# Patient Record
Sex: Male | Born: 1965 | Race: Black or African American | Hispanic: No | Marital: Single | Smoking: Current every day smoker
Health system: Southern US, Community
[De-identification: ages and names within clinical notes are randomized; demographics above are authoritative.]

---

## 2008-05-28 ENCOUNTER — Emergency Department: Payer: Self-pay | Admitting: Emergency Medicine

## 2008-05-28 IMAGING — CR DG CHEST 2V
1 series · 2 of 2 positions shown · non-contrast
Comparison: none

REASON FOR EXAM: abnormal EKG , peripheral edema
COMMENTS:

PROCEDURE:     DXR - DXR CHEST PA (OR AP) AND LATERAL  - [DATE]  [DATE]
RESULT:     There is evidence of diffuse pulmonary edema. There is no
significant effusion. The heart is mildly enlarged. There is slightly
shallow inspiration. There is motion artifact on the lateral view.

[Series 1: view not recorded · 0.17mm/px · 2 of 2 slices shown]
[im 1/2]
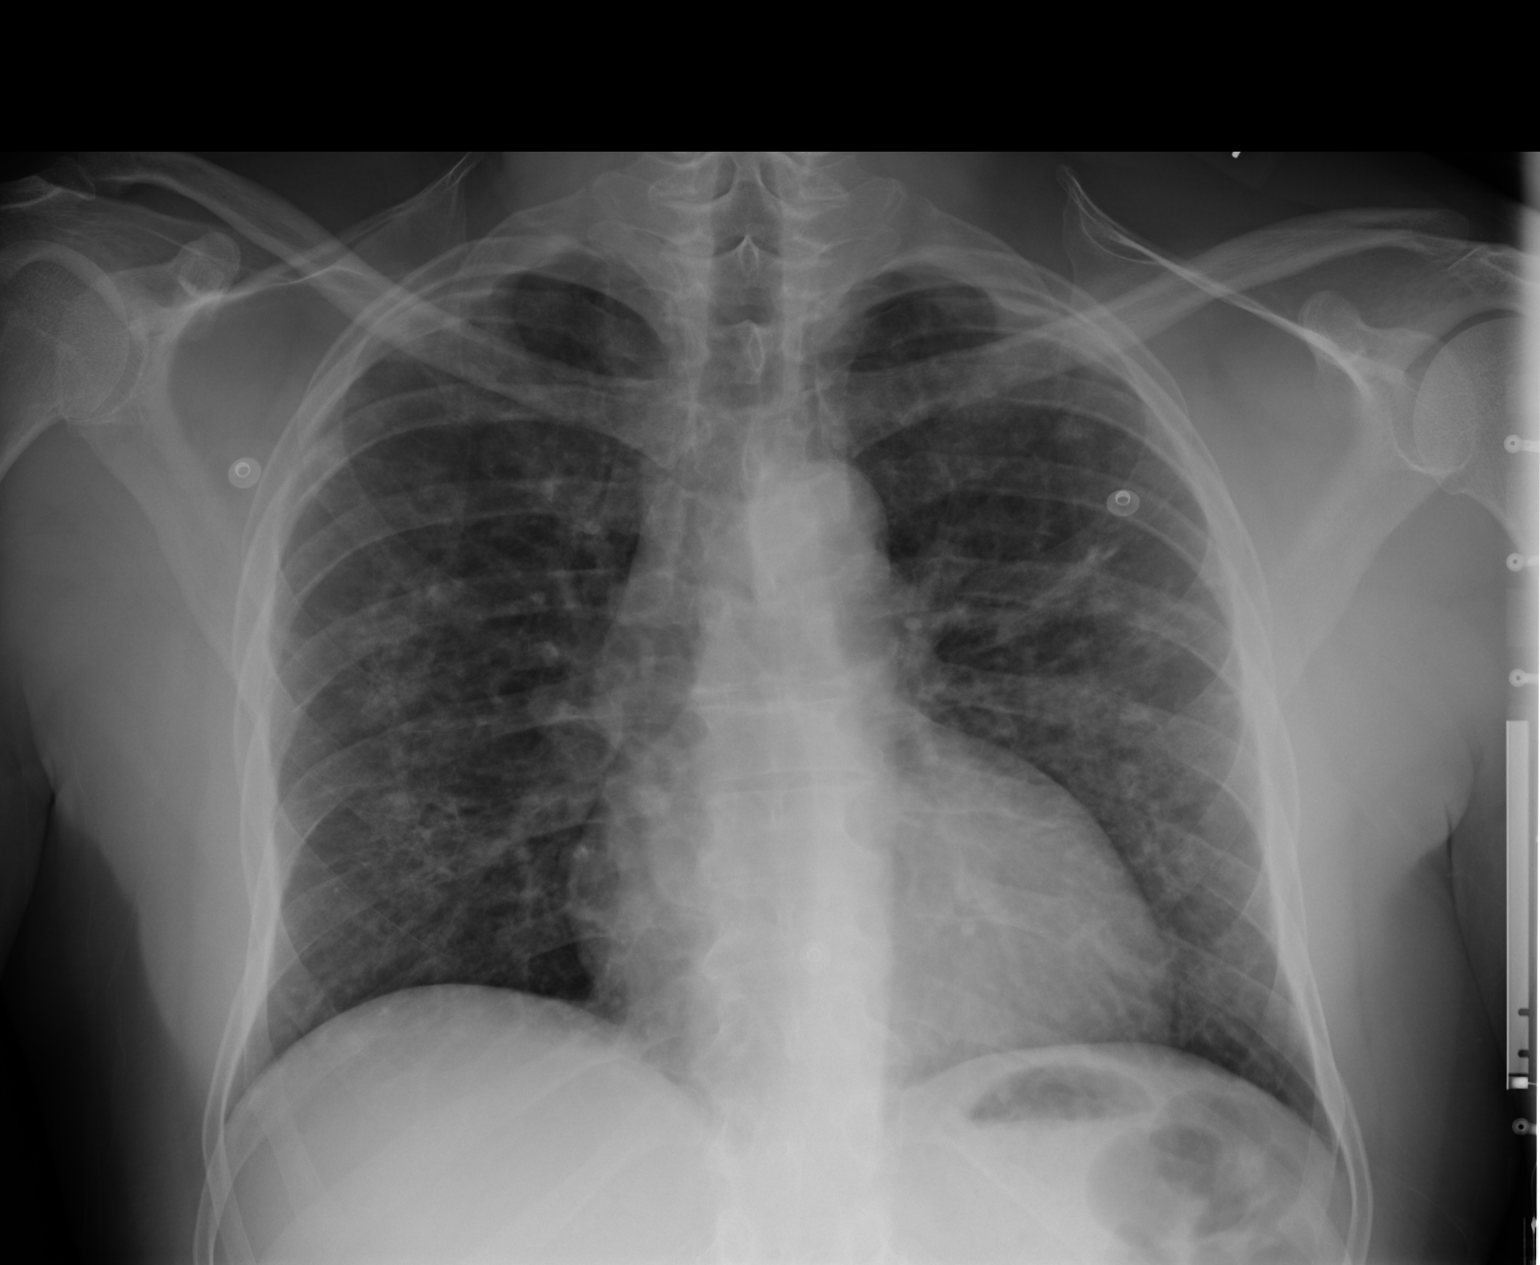
[im 2/2]
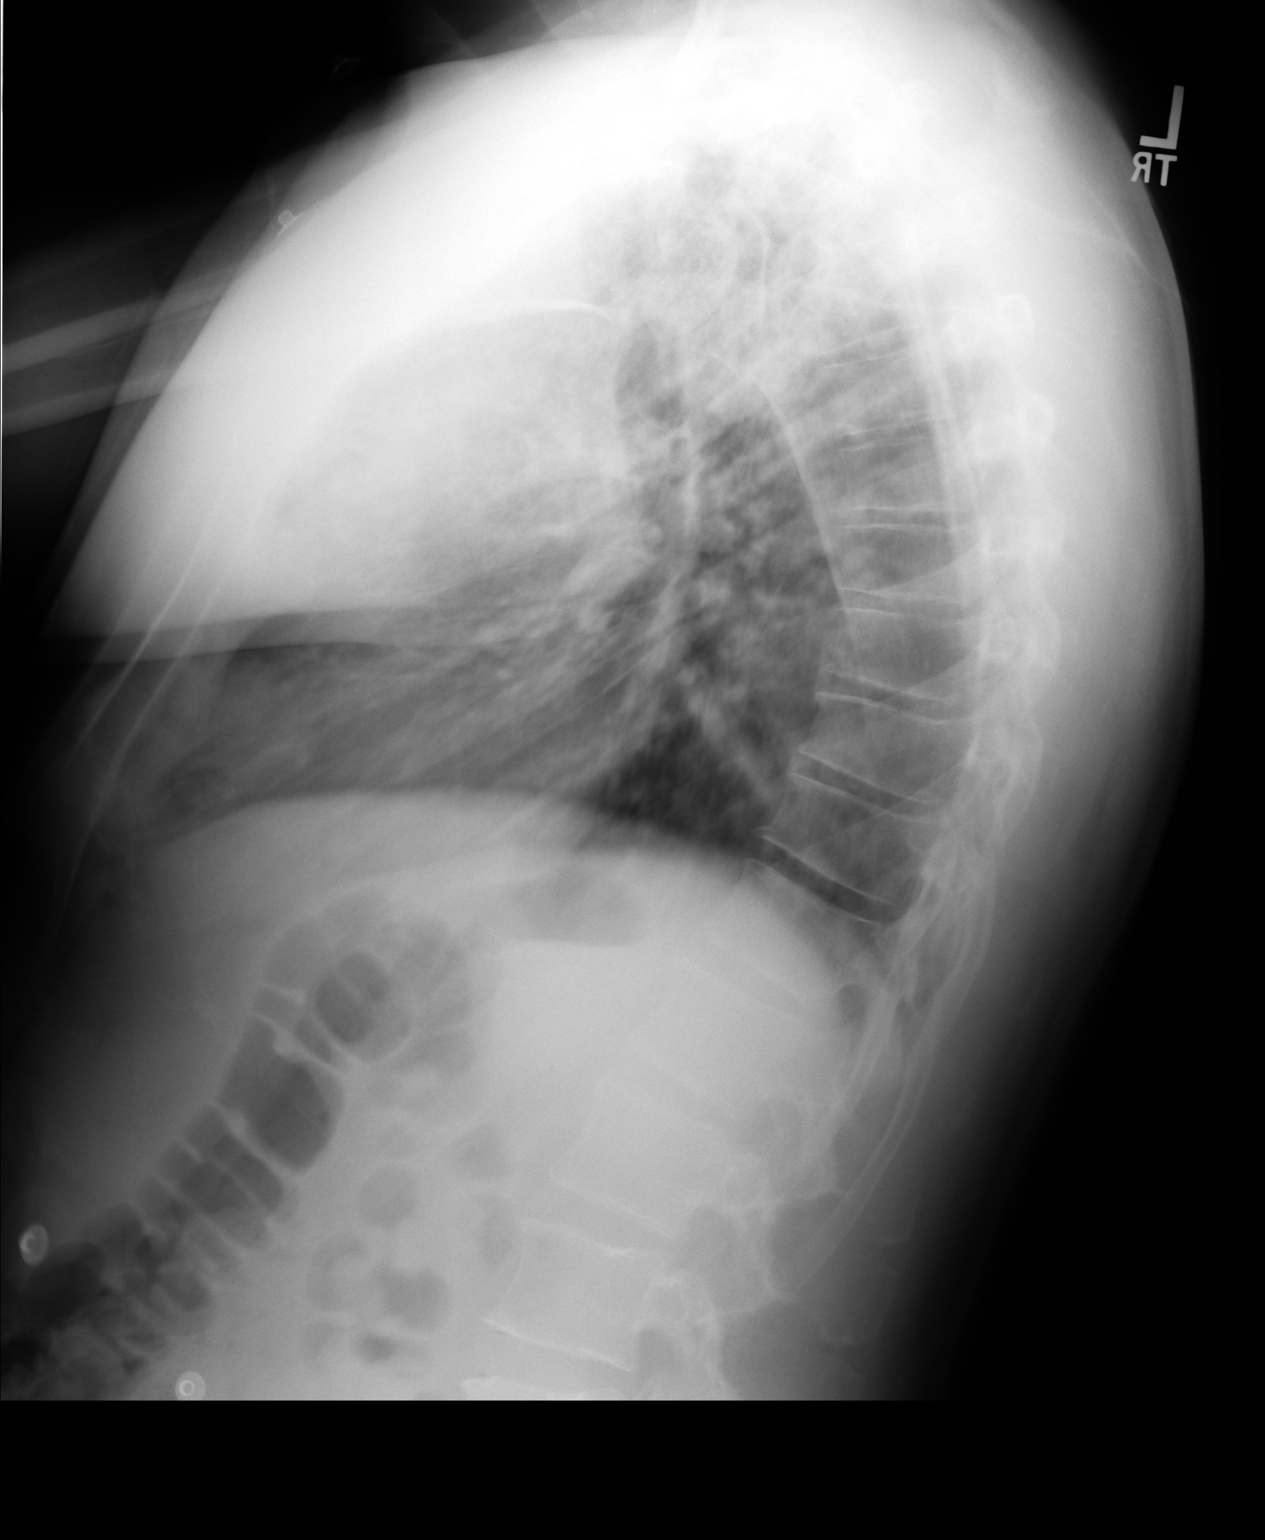

[2 of 2 positions shown; findings below may reference images not displayed]

IMPRESSION: The appearance suggests pulmonary edema. Bronchitis with
areas of patchy infiltrate or atelectasis may give a similar appearance.
Clinical correlation and continued follow-up is recommended.

## 2014-03-26 ENCOUNTER — Ambulatory Visit: Payer: Self-pay | Admitting: Internal Medicine

## 2014-03-26 LAB — BASIC METABOLIC PANEL
ANION GAP: 6 — AB (ref 7–16)
BUN: 17 mg/dL (ref 7–18)
CALCIUM: 9.4 mg/dL (ref 8.5–10.1)
Chloride: 103 mmol/L (ref 98–107)
Co2: 32 mmol/L (ref 21–32)
Creatinine: 1.38 mg/dL — ABNORMAL HIGH (ref 0.60–1.30)
EGFR (African American): >60
GFR CALC NON AF AMER: 58 — AB
GLUCOSE: 162 mg/dL — AB (ref 65–99)
OSMOLALITY: 286 (ref 275–301)
Potassium: 3.1 mmol/L — ABNORMAL LOW (ref 3.5–5.1)
SODIUM: 141 mmol/L (ref 136–145)

## 2014-03-26 LAB — PROTEIN / CREATININE RATIO, URINE
Creatinine, Urine: 55.8 mg/dL (ref 30.0–125.0)
Protein, Random Urine: 21 mg/dL — ABNORMAL HIGH (ref 0–12)
Protein/Creat. Ratio: 376 mg/gCREAT — ABNORMAL HIGH (ref 0–200)

## 2021-04-25 ENCOUNTER — Other Ambulatory Visit: Payer: Self-pay

## 2021-04-25 ENCOUNTER — Emergency Department: Payer: BLUE CROSS/BLUE SHIELD

## 2021-04-25 ENCOUNTER — Emergency Department
Admission: EM | Admit: 2021-04-25 | Discharge: 2021-04-25 | Disposition: A | Discharge status: Home / Self Care | Payer: BLUE CROSS/BLUE SHIELD | Attending: Emergency Medicine | Admitting: Emergency Medicine

## 2021-04-25 DIAGNOSIS — N189 Chronic kidney disease, unspecified: Secondary | ICD-10-CM | POA: Diagnosis not present

## 2021-04-25 DIAGNOSIS — R531 Weakness: Secondary | ICD-10-CM | POA: Insufficient documentation

## 2021-04-25 DIAGNOSIS — R739 Hyperglycemia, unspecified: Secondary | ICD-10-CM | POA: Insufficient documentation

## 2021-04-25 DIAGNOSIS — Y9301 Activity, walking, marching and hiking: Secondary | ICD-10-CM | POA: Insufficient documentation

## 2021-04-25 DIAGNOSIS — R29898 Other symptoms and signs involving the musculoskeletal system: Secondary | ICD-10-CM

## 2021-04-25 DIAGNOSIS — W19XXXA Unspecified fall, initial encounter: Secondary | ICD-10-CM | POA: Insufficient documentation

## 2021-04-25 DIAGNOSIS — I129 Hypertensive chronic kidney disease with stage 1 through stage 4 chronic kidney disease, or unspecified chronic kidney disease: Secondary | ICD-10-CM | POA: Diagnosis not present

## 2021-04-25 LAB — URINALYSIS, ROUTINE W REFLEX MICROSCOPIC
Bacteria, UA: NONE SEEN
Bilirubin Urine: NEGATIVE
Glucose, UA: >=500 mg/dL — AB
Ketones, ur: NEGATIVE mg/dL
Leukocytes,Ua: NEGATIVE
Nitrite: NEGATIVE
Protein, ur: NEGATIVE mg/dL
Specific Gravity, Urine: 1.024 (ref 1.005–1.030)
Squamous Epithelial / HPF: NONE SEEN (ref 0–5)
pH: 6 (ref 5.0–8.0)

## 2021-04-25 LAB — CBC WITH DIFFERENTIAL/PLATELET
Abs Immature Granulocytes: 0.03 10*3/uL (ref 0.00–0.07)
Basophils Absolute: 0 10*3/uL (ref 0.0–0.1)
Basophils Relative: 0 %
Eosinophils Absolute: 0 10*3/uL (ref 0.0–0.5)
Eosinophils Relative: 0 %
HCT: 47.9 % (ref 39.0–52.0)
Hemoglobin: 15.8 g/dL (ref 13.0–17.0)
Immature Granulocytes: 0 %
Lymphocytes Relative: 9 %
Lymphs Abs: 0.9 10*3/uL (ref 0.7–4.0)
MCH: 29 pg (ref 26.0–34.0)
MCHC: 33 g/dL (ref 30.0–36.0)
MCV: 87.9 fL (ref 80.0–100.0)
Monocytes Absolute: 0.3 10*3/uL (ref 0.1–1.0)
Monocytes Relative: 3 %
Neutro Abs: 8.9 10*3/uL — ABNORMAL HIGH (ref 1.7–7.7)
Neutrophils Relative %: 88 %
Platelets: 276 10*3/uL (ref 150–400)
RBC: 5.45 MIL/uL (ref 4.22–5.81)
RDW: 12.6 % (ref 11.5–15.5)
WBC: 10 10*3/uL (ref 4.0–10.5)
nRBC: 0 % (ref 0.0–0.2)

## 2021-04-25 LAB — BASIC METABOLIC PANEL
Anion gap: 14 (ref 5–15)
BUN: 20 mg/dL (ref 6–20)
CO2: 25 mmol/L (ref 22–32)
Calcium: 10.4 mg/dL — ABNORMAL HIGH (ref 8.9–10.3)
Chloride: 103 mmol/L (ref 98–111)
Creatinine, Ser: 1.72 mg/dL — ABNORMAL HIGH (ref 0.61–1.24)
GFR, Estimated: 46 mL/min — ABNORMAL LOW (ref >60)
Glucose, Bld: 196 mg/dL — ABNORMAL HIGH (ref 70–99)
Potassium: 3.6 mmol/L (ref 3.5–5.1)
Sodium: 142 mmol/L (ref 135–145)

## 2021-04-25 LAB — TROPONIN I (HIGH SENSITIVITY): Troponin I (High Sensitivity): 11 ng/L (ref <18)

## 2021-04-25 IMAGING — CT CT HEAD W/O CM
4 series · 16 of 47 positions shown, 18 images · non-contrast
Comparison: None.

CLINICAL DATA: Fall, weakness



[Series 2: head wo · axial · 0.44mm/px · z∈[-102,+18]mm · 7 of 32 slices shown, 9 images]
[im 4/32  brain]
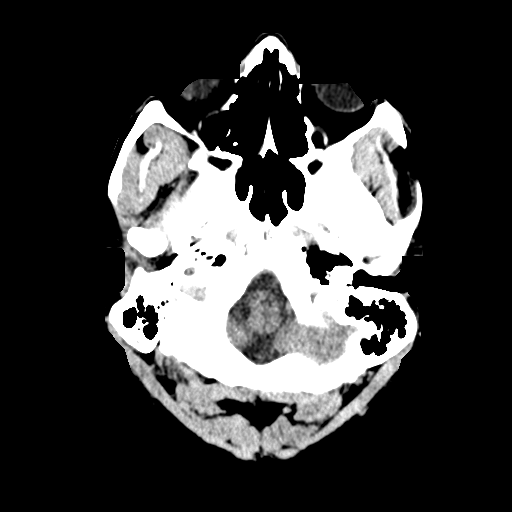
[im 4/32  bone]
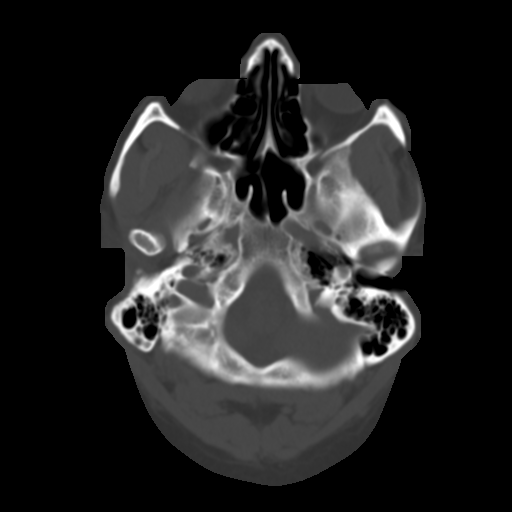
[im 8/32  brain]
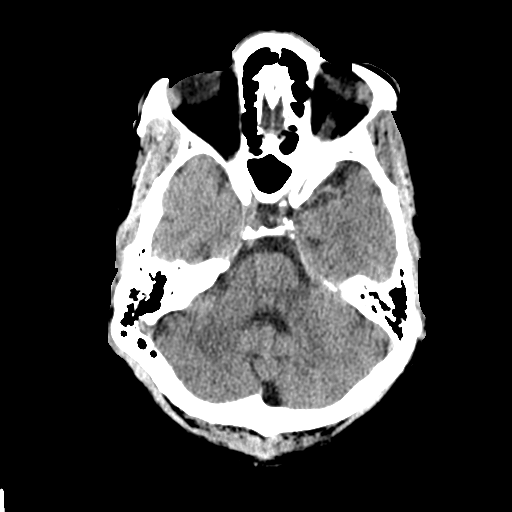
[im 12/32  brain]
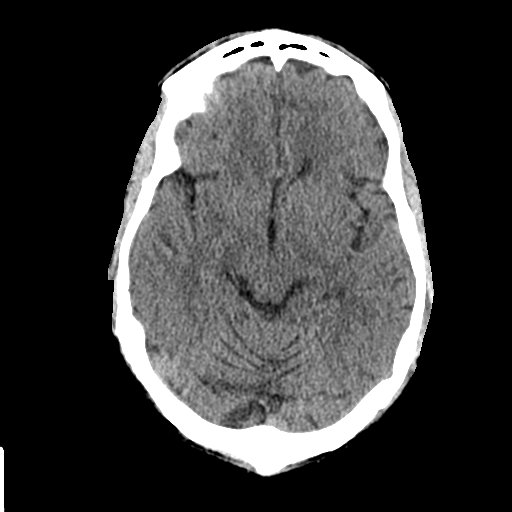
[im 16/32  brain]
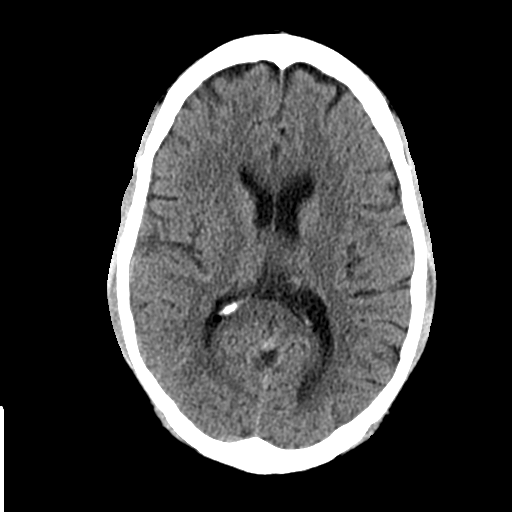
[im 20/32  brain]
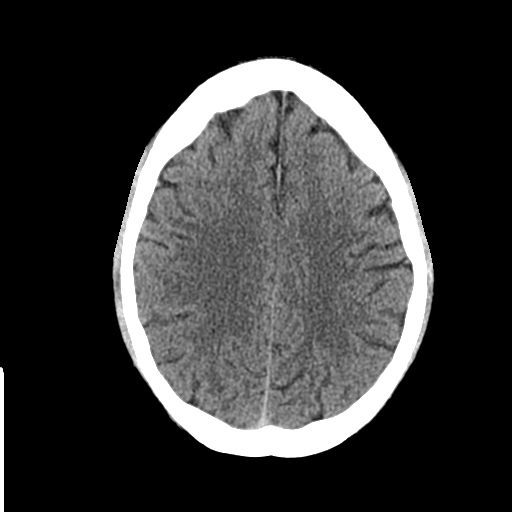
[im 20/32  bone]
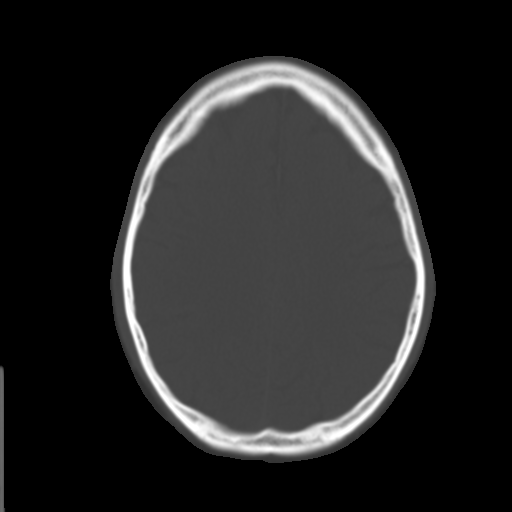
[im 24/32  brain]
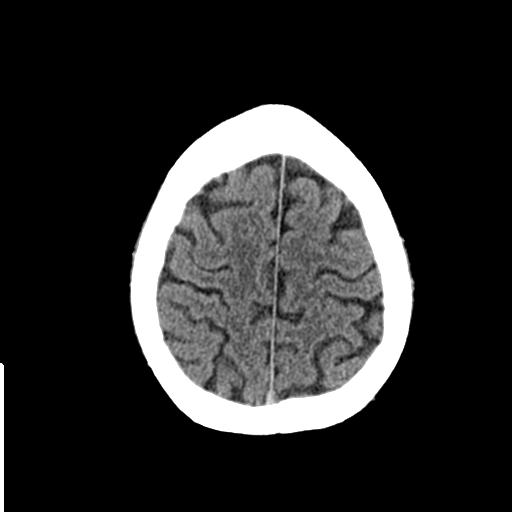
[im 28/32  brain]
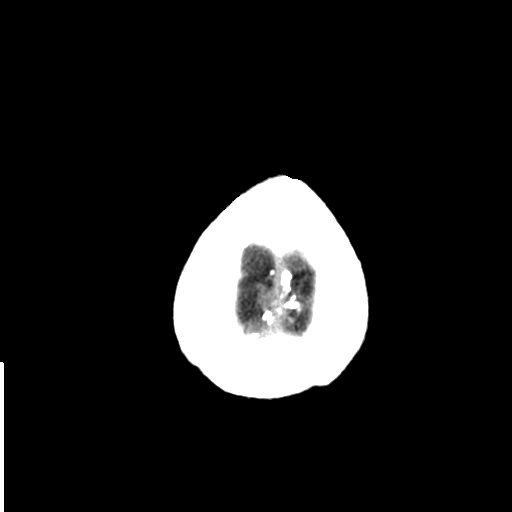

[Series 3: head bone · axial · 0.44mm/px · z∈[-103,-71]mm · 3 of 80 slices shown]
[im 8/80  bone]
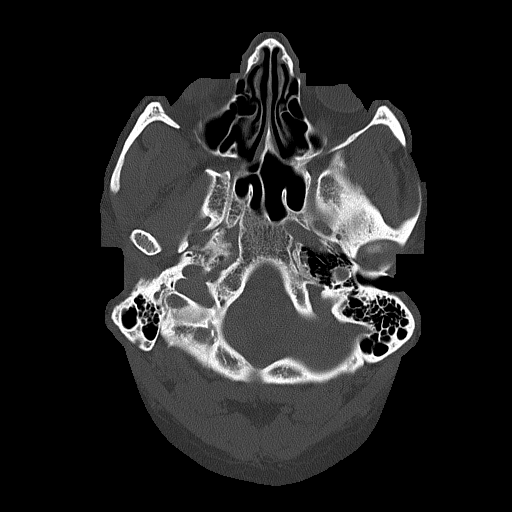
[im 16/80  bone]
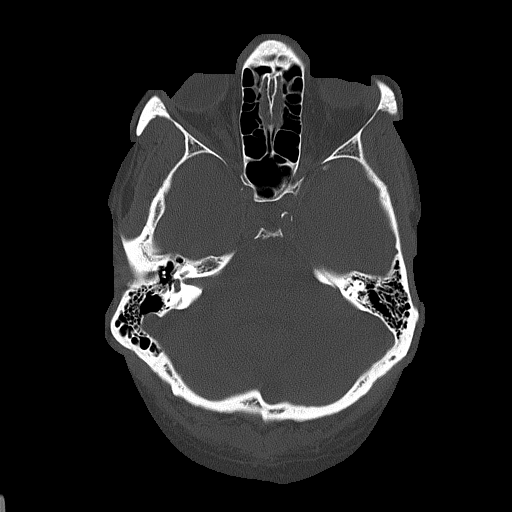
[im 24/80  bone]
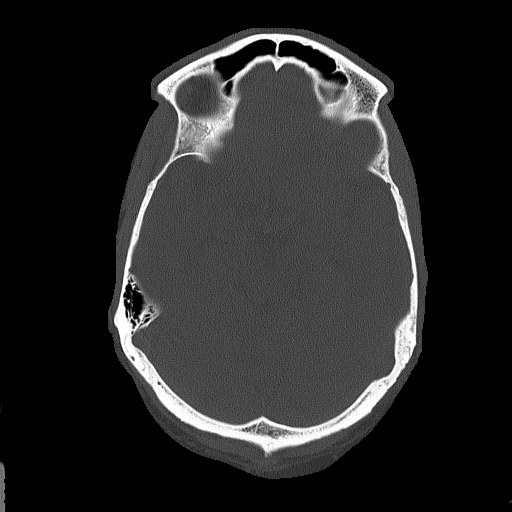

[Series 4: coronal soft tissue · coronal · 0.33mm/px · 3 of 70 slices shown]
[im 24/70  brain]
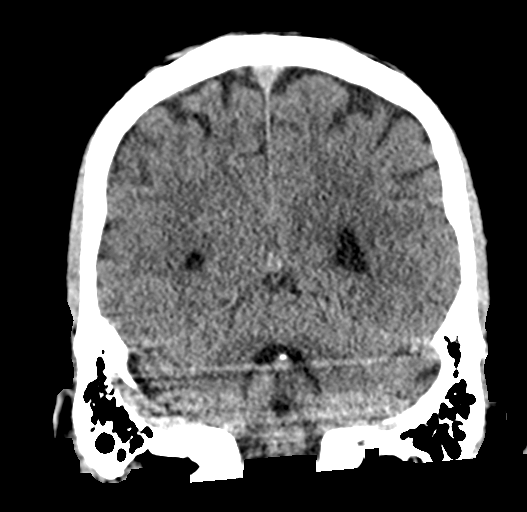
[im 31/70  brain]
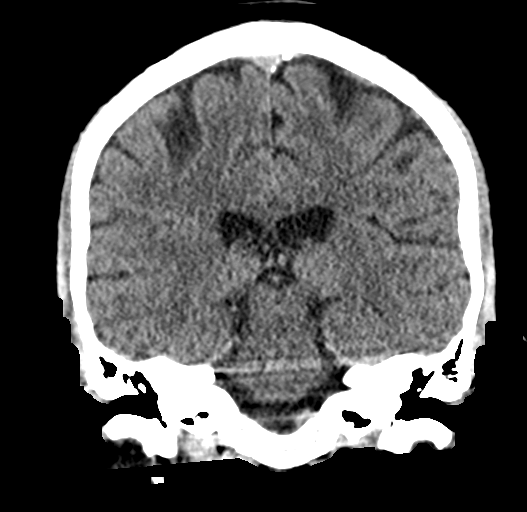
[im 39/70  brain]
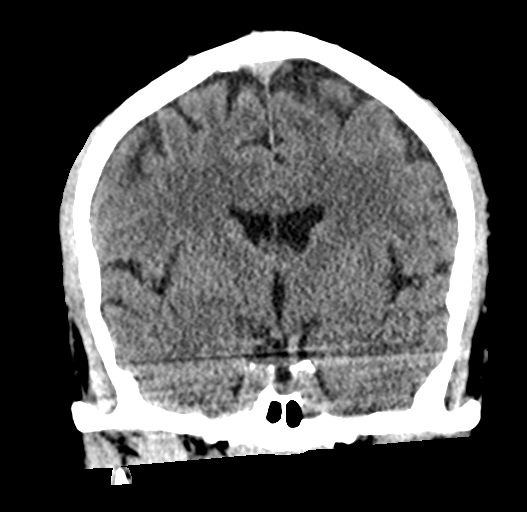

[Series 5: sagittal soft tissue · sagittal · 0.33mm/px · 3 of 56 slices shown]
[im 19/56  brain]
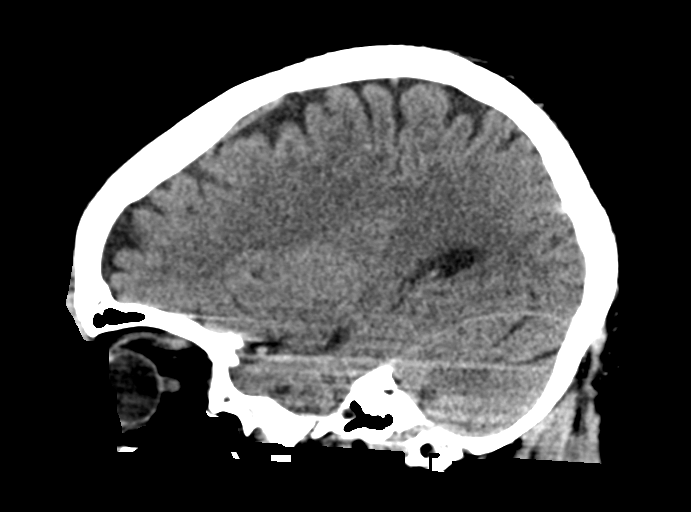
[im 28/56  brain]
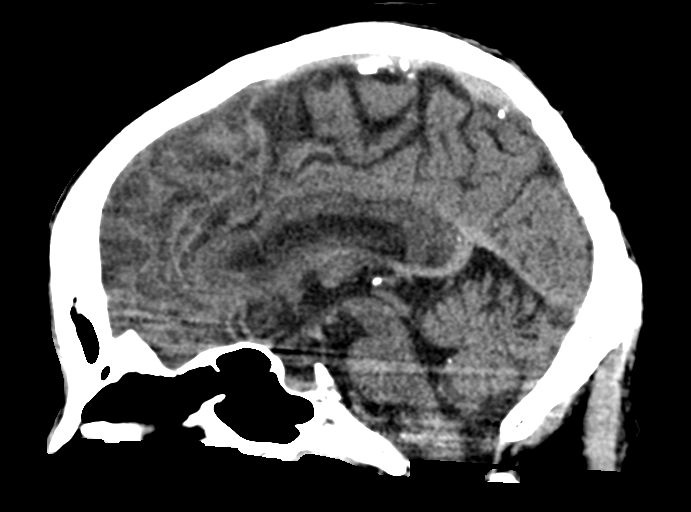
[im 37/56  brain]
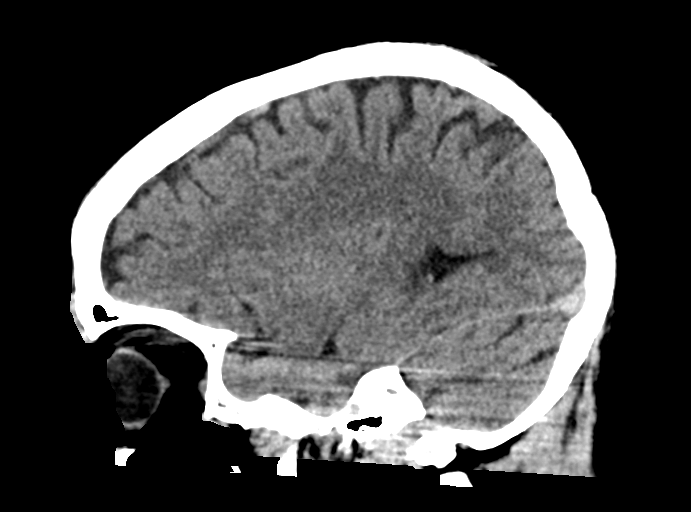

[16 of 47 positions shown; findings below may reference images not displayed]

FINDINGS: Brain: No evidence of acute infarction, hemorrhage, hydrocephalus,
extra-axial collection or mass lesion/mass effect.

Vascular: No hyperdense vessel or unexpected calcification.

Skull: Normal. Negative for fracture or focal lesion.

Sinuses/Orbits: No acute finding.

Other: None.
IMPRESSION: No acute intracranial pathology.

## 2021-04-25 IMAGING — CR DG HIP (WITH OR WITHOUT PELVIS) 2-3V*L*
1 series · 3 of 3 positions shown · non-contrast
Comparison: None.

CLINICAL DATA: Fall, left leg pain.

EXAM:
DG HIP (WITH OR WITHOUT PELVIS) 2-3V LEFT

[Series 1: dg hip unilat w or w/o pelvis 2-3 views  · non-contrast · 0.14mm/px · 3 of 3 slices shown]
[im 1/3]
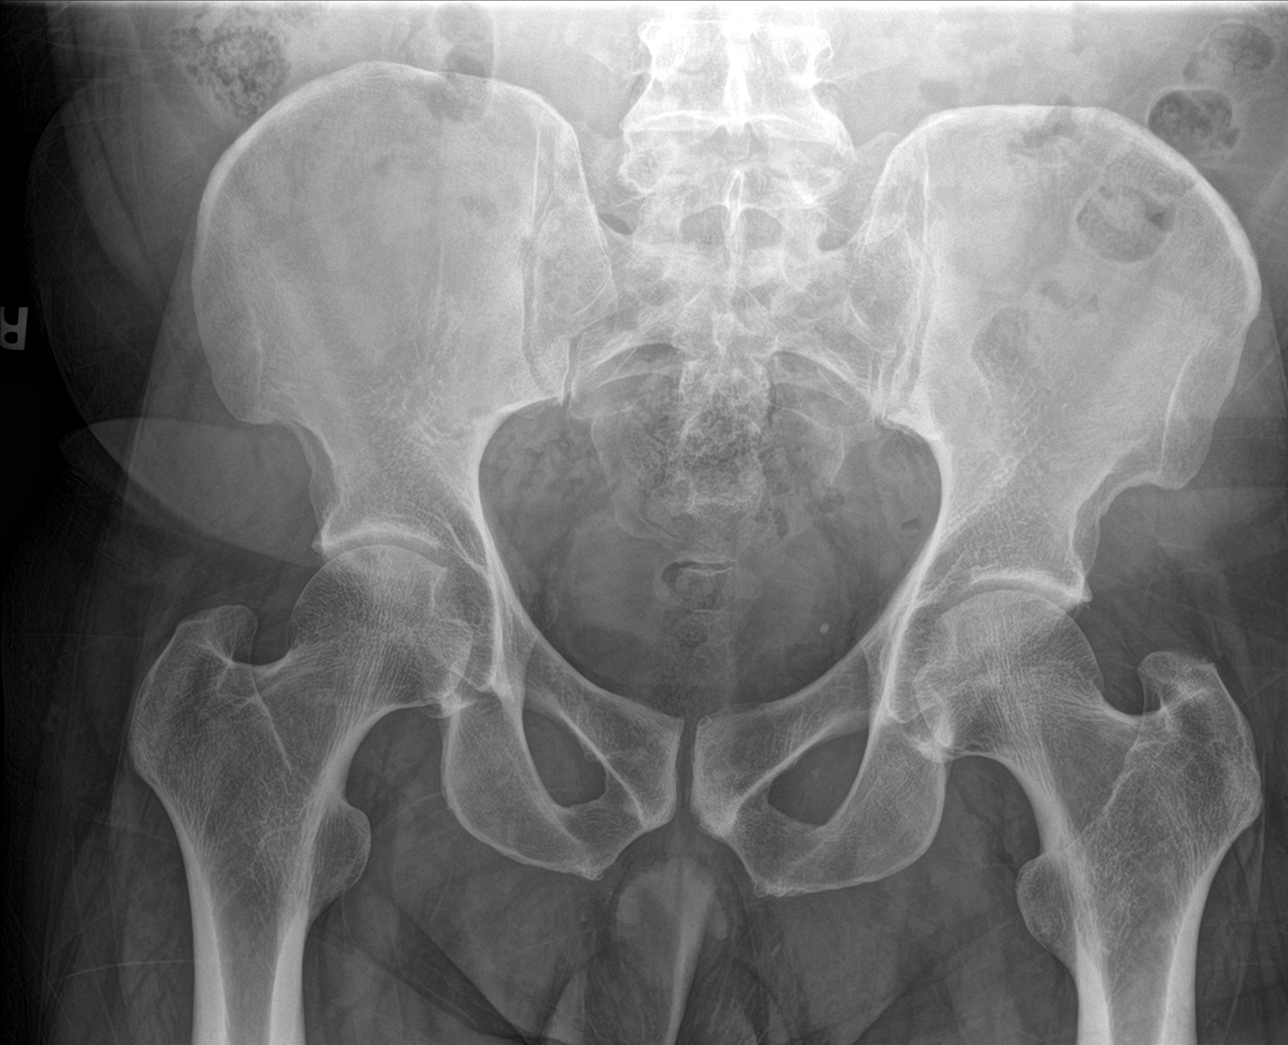
[im 2/3]
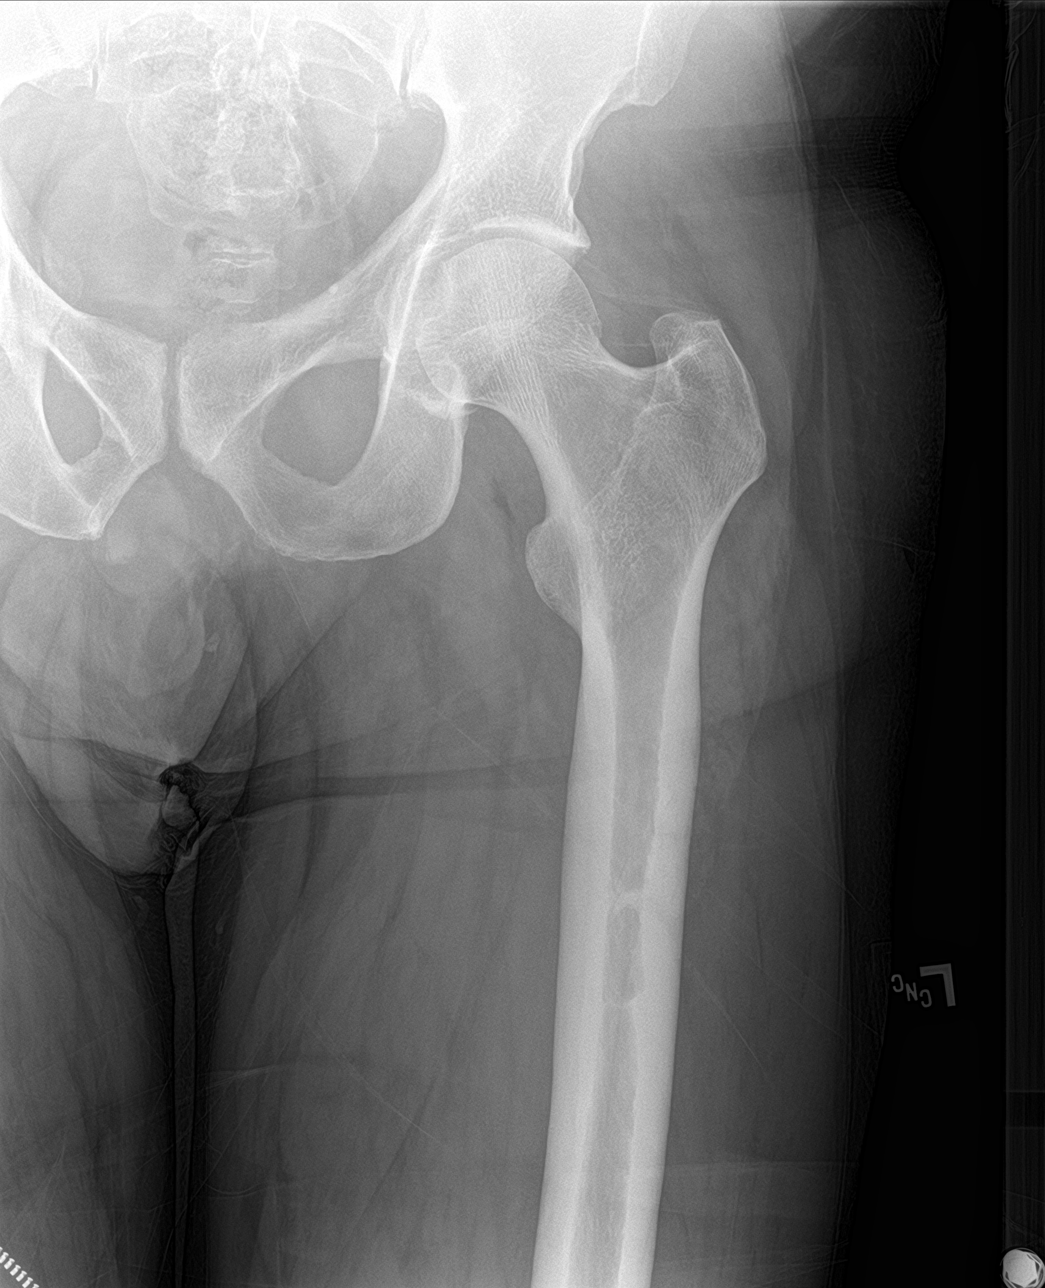
[im 3/3]
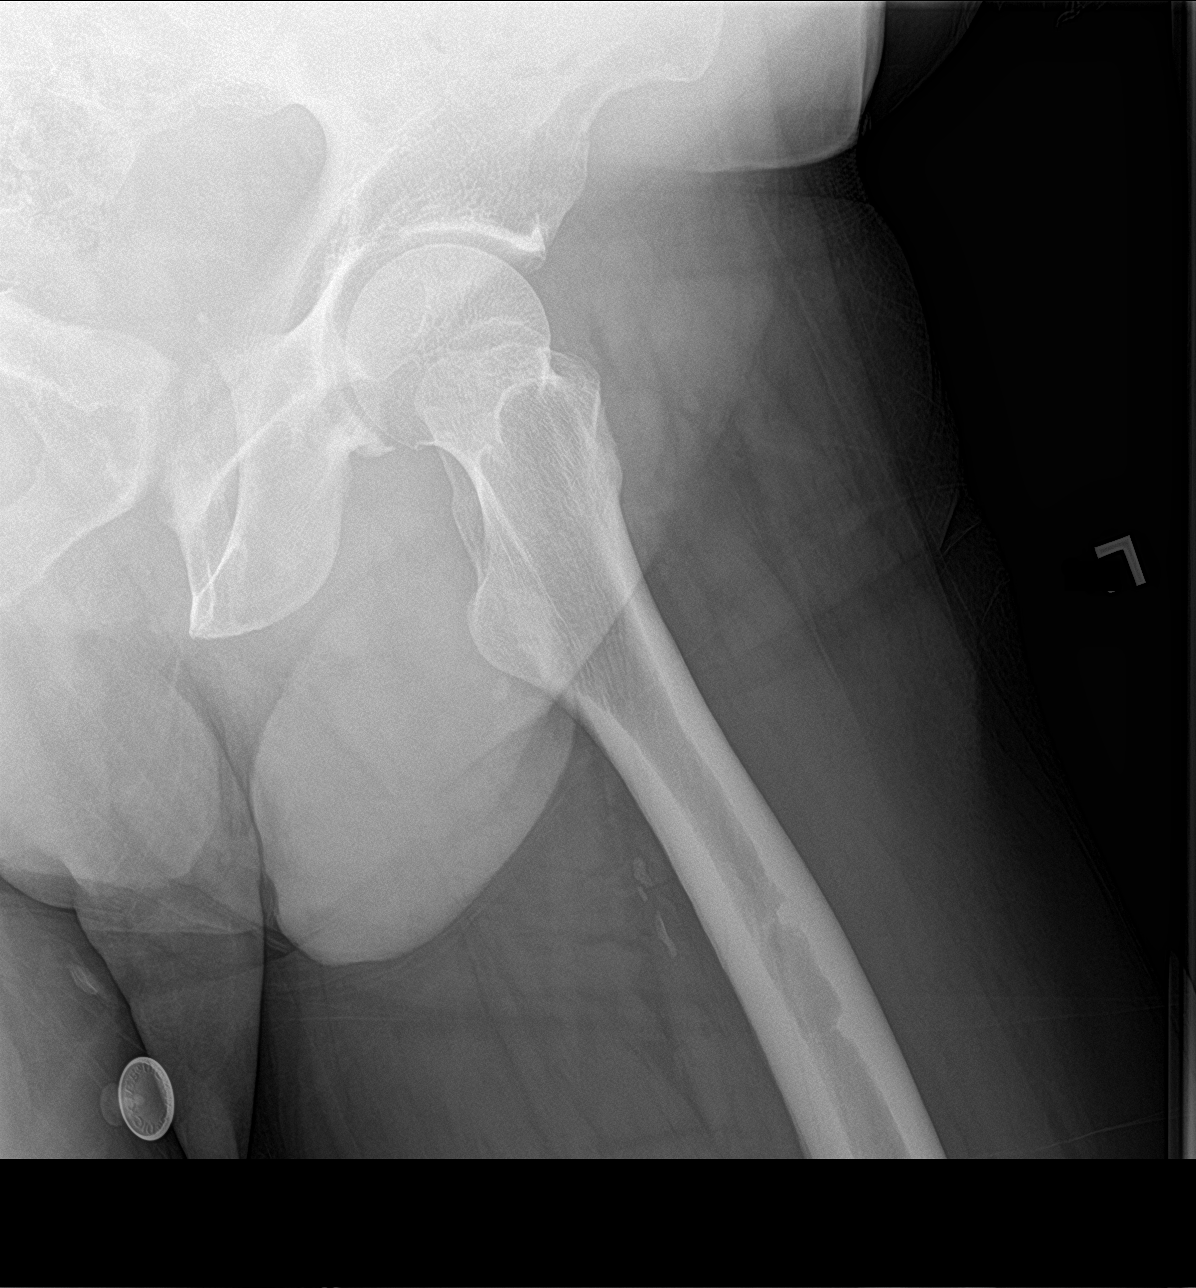

[3 of 3 positions shown; findings below may reference images not displayed]

FINDINGS: No acute fracture of the pelvis or left hip. Femoral head is well
seated in the acetabulum. The hip joint spaces preserved. Pubic rami
are intact. Pubic symphysis and sacroiliac joints are congruent. No
erosions or evidence of avascular necrosis. No soft tissue
abnormalities.
IMPRESSION: No fracture or acute osseous findings of the pelvis or left hip.

## 2021-04-25 NOTE — ED Notes (Signed)
Pt to CT

## 2021-04-25 NOTE — ED Triage Notes (Addendum)
Pt come with c/o fall. Pt states his left leg gave out and he fell. No loc or hitting head.  ? ?Pt denies any CP, dizziness, sob or weakness. ?

## 2021-04-25 NOTE — Discharge Instructions (Signed)
Please call and schedule a follow up with your primary care provider.  ? ?Use your walker to prevent falling. ? ?Return to the ER for symptoms that change or worsen if unable to schedule an appointment. ?

## 2021-04-25 NOTE — ED Notes (Signed)
See triage note. Pt states since last night L leg has been weak and today L leg gave out on him and he fell 3 times today. No head trauma. Denies any known injuries. ?

## 2021-04-25 NOTE — ED Provider Notes (Signed)
? ?[facility] ?Provider Note ? ? ? Event Date/Time  ? First MD Initiated Contact with Patient 04/25/21 1730   ?  (approximate) ? ? ?History  ? ?Fall ? ? ?HPI ? ?Shane Dunn is a 56 y.o. male with a history of chronic kidney disease, hypertension and as listed in EMR presents to the emergency department for treatment and evaluation of sudden onset left leg weakness.  Patient states that after work he was walking and his left leg completely gave out causing him to fall.  He did not strike his head or lose consciousness.  Since that time, he has been unable to stand and bear weight on the leg.  He denies headache, weakness in upper extremities, change in vision, pain, loss of bowel or bladder control. ? ?  ? ? ?Physical Exam  ? ?Triage Vital Signs: ?ED Triage Vitals [04/25/21 1707]  ?Enc Vitals Group  ?   BP (!) 134/94  ?   Pulse Rate 92  ?   Resp 18  ?   Temp 98.8 ?F (37.1 ?C)  ?   Temp src   ?   SpO2 100 %  ?   Weight   ?   Height   ?   Head Circumference   ?   Peak Flow   ?   Pain Score 3  ?   Pain Loc   ?   Pain Edu?   ?   Excl. in GC?   ? ? ?Most recent vital signs: ?Vitals:  ? 04/25/21 1707 04/25/21 1922  ?BP: (!) 134/94 (!) 156/106  ?Pulse: 92 91  ?Resp: 18 (!) 21  ?Temp: 98.8 ?F (37.1 ?C)   ?SpO2: 100% 96%  ? ? ?General: Awake, no distress.  ?CV:  Good peripheral perfusion.  ?Resp:  Normal effort.  ?Abd:  No distention.  ?Other:  Neuro exam unremarkable with the exception of patient inability to passively bend left knee without using his hands ? ? ?ED Results / Procedures / Treatments  ? ?Labs ?(all labs ordered are listed, but only abnormal results are displayed) ?Labs Reviewed  ?BASIC METABOLIC PANEL - Abnormal; Notable for the following components:  ?    Result Value  ? Glucose, Bld 196 (*)   ? Creatinine, Ser 1.72 (*)   ? Calcium 10.4 (*)   ? GFR, Estimated 46 (*)   ? All other components within normal limits  ?CBC WITH DIFFERENTIAL/PLATELET - Abnormal; Notable for the following  components:  ? Neutro Abs 8.9 (*)   ? All other components within normal limits  ?URINALYSIS, ROUTINE W REFLEX MICROSCOPIC - Abnormal; Notable for the following components:  ? Color, Urine STRAW (*)   ? APPearance CLEAR (*)   ? Glucose, UA >=500 (*)   ? Hgb urine dipstick MODERATE (*)   ? All other components within normal limits  ?TROPONIN I (HIGH SENSITIVITY)  ?TROPONIN I (HIGH SENSITIVITY)  ? ? ? ?EKG ? ?ED ECG REPORT ?I, Kem Boroughs, FNP-BC personally viewed and interpreted this ECG. ? ? Date: 04/25/2021 ? EKG Time: 1912 ? Rate: 82 ? Rhythm: normal EKG, normal sinus rhythm ? Axis: normal ? Intervals:none ? ST&T Change: non ? ? ? ?RADIOLOGY ? ?Image and radiology report reviewed by me. ? ?CT head negative for acute concerns. ? ?X-ray of the left hip negative for acute concerns. ? ?PROCEDURES: ? ?Critical Care performed: No ? ?Procedures ? ? ?MEDICATIONS ORDERED IN ED: ?Medications - No data to display ? ? ?  IMPRESSION / MDM / ASSESSMENT AND PLAN / ED COURSE  ? ?I have reviewed the triage note. ? ?Differential diagnosis includes, but is not limited to, Hip fracture, CVA, Hip strain ? ?56 year old male presents to the ER due to feeling weak in his left hip. He denies recent injury of the hip but earlier he fell back down into his chair because it "gave out."  ? ?X-ray of the hip is negative. Neuro exam is all reassuring. CT head is without acute findings. Labs show known kidney disease and hyperglycemia. No DKA. No indication of infection.  ? ?On exam, he is not tender over the hip/sacrum/lumbar. No pain with internal or external rotation of the hip.  ? ?Patient able to stand without assistance, but seems afraid to try and take a step with the left leg. Walker provided and he was able to ambulate without additional assistance. He reported feeling "stable." At one point he hopped up in the air with both feet and landed steadily.  ? ?Plan will be to discharge him home to follow up with primary care. Return  precautions discussed. ? ?  ? ? ?FINAL CLINICAL IMPRESSION(S) / ED DIAGNOSES  ? ?Final diagnoses:  ?Complaints of leg weakness  ? ? ? ?Rx / DC Orders  ? ?ED Discharge Orders   ? ? None  ? ?  ? ? ? ?Note:  This document was prepared using Dragon voice recognition software and may include unintentional dictation errors. ?  ?Chinita Pester, FNP ?04/25/21 1947 ? ?  ?Sharyn Creamer, MD ?04/25/21 2228 ? ?

## 2021-04-25 NOTE — ED Notes (Signed)
Observed pt ambulating with a walker, noted steady and states he feels more "comfortable" with the walker. ?

## 2021-06-29 NOTE — Therapy (Signed)
?OUTPATIENT PHYSICAL THERAPY EVALUATION ? ? ?Patient Name: Shane Dunn ?MRN: [id number] ?DOB:1965-07-18, 56 y.o., male ?Today's Date: 07/05/2021 ? ? PT End of Session - 07/05/21 1410   ? ? Visit Number 1   ? Number of Visits 24   ? Date for PT Re-Evaluation 12/20/21   ? Authorization Type [employer] reporting from 07/05/2021   ? Authorization Time Period VL 30 PT/OT/Chiro   ? Authorization - Visit Number 1   ? Authorization - Number of Visits 30   ? Progress Note Due on Visit 10   ? PT Start Time 1030   ? PT Stop Time 1120   ? PT Time Calculation (min) 50 min   ? Activity Tolerance Patient tolerated treatment well;No increased pain   ? Behavior During Therapy WFL for tasks assessed/performed;Flat affect   ? ?  ?  ? ?  ? ? ?History reviewed. No pertinent past medical history. ?History reviewed. No pertinent surgical history. ?There are no problems to display for this patient. ? ? ?PCP: [facility] ? ?REFERRING PROVIDER: Luella Cook, PA-C [facility]) ? ?REFERRING DIAG: leg weakness, difficulty walking ? ?THERAPY DIAG:  ?Muscle weakness (generalized) ? ?Pain in left leg ? ?Difficulty in walking, not elsewhere classified ? ?History of falling ? ?ONSET DATE: 04/25/2021 ? ?SUBJECTIVE:                                                                                                                                                                                          ? ?SUBJECTIVE STATEMENT: ?Patient is here with his mother [name] who contributes as needed to history. 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 Patient states in early  march, 2.5 months ago, he wrote down on a notepad he provided to PT that he had "muscle weakness, tenders, nurves, muscle pain." He states his condition started with no apparent reason and when he tried to walk he fell. He has pain on the inside of his lower leg and  when that happens his leg will give out. He also has pain over night. It hurts some during the day. Pain comes and goes. When he has pain his left foot is cold to the touch. It is getting better since it started. He is able to walk better. He is doing little bit of things at a time. He ambulated without assistive device before his pain started. He initially needed a walker to ambulate so he is using a SPC now and can shuffle short periods of time without the cane. He has not  taken any medications for his leg. He used to jog before this happened to him. Patient also reports his left UE has been bothering him since his fall when his leg gave out and he would like that to be examined. He seems to have difficulty understanding what numbness/tingling would feel like and is unable to confirm or deny if he has experienced it.  ? ?PERTINENT HISTORY:  ?Patient is a 56 y.o. male who presents to outpatient physical therapy with a referral for medical diagnosis leg weakness, difficulty walking. This patient's chief complaints consist of sudden onset left lower leg pain, possibly paresthesia, and L LE weakness resulting in falls and leading to the following functional deficits: difficulty with usual physical function and basic mobility such as bed mobility, transfers, community and household ambulation, working, lifting, carrying, bending, jogging, running. Relevant past medical history and comorbidities include HTN, hyperlipidemia, diabetes, CKD stage 3, likely on autism spectrum.  Patient denies hx of cancer, stroke, seizures, lung problems, heart problems, unexplained weight loss, unexplained changes in bowel or bladder problems, unexplained stumbling or dropping things, and spinal surgery ? ? ?PAIN:  ?Are you having pain? Yes: faces scale (using visual faces with numbers: Current: 6/10 "a little",  Best: 2/10, Worst: 8/10. ?Pain location: medial lower leg, sometimes knee to foot ?Pain description: tightening up, foot feels  cold to touch, knee to foot feels hard ?Aggravating factors: nothing ?Relieving factors: nothing  ? ?FUNCTIONAL LIMITATIONS: Difficulty with usual physical function and basic mobility such as bed mobility, transfers, community and household ambulation, working, lifting, carrying, bending, jogging, running. ? ?PRECAUTIONS: Other: not allowed to eat sugar, salt, and sweet drinks.  ? ?WEIGHT BEARING RESTRICTIONS No ? ?FALLS:  ?Has patient fallen in last 6 months? Yes. Number of falls 4 because of his left leg near the start of this condition.  ? ?LIVING ENVIRONMENT: ?Lives with: lives with their family, mother [name] ?Lives in: House/apartment ?Stairs: Yes: External: 2 steps; none ?Has following equipment at home: Single point cane and Walker - 2 wheeled ? 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 Lots of standing/walking, lifting, carrying.  ? ?LEISURE: ride around his scooter, go to the park, visit lady friend, do things for fun, jog and run.  ? ?PLOF: Independent with basic ADLs, Independent with household mobility without device, Independent with community mobility without device, and Independent with homemaking with ambulation. Independent with all physical tasks; needs assistance for some cognitive tasks.  ? ?PATIENT GOALS "I want the pain to go away from the inside of my leg just like my right leg with no pain in it," "I want to get back to normal, to walking and running" " get my legs strengthened up and healed up" "and jogging too" ? ? ?OBJECTIVE ? ?DIAGNOSTIC FINDINGS:  ?Head CT 04/25/2021, showed no acute intracranial pathology ?X-ray of the left hip 04/25/2021 negative for acute concerns. ?Insurance denied coverage of MD order for Lumbar and Brain MRIs.  ? ?L hip xray report from 04/25/2021:  ?FINDINGS: ?No acute fracture of the pelvis or left hip. Femoral head is well ?seated in the acetabulum. The hip joint spaces preserved.  Pubic rami ?are intact. Pubic symphysis and sacroiliac joints are congruent. No ?erosions or evidence of avascular necrosis. No soft tissue ?abnormalities. ?  ?IMPRESSION: ?No fracture or acute osseous findings of the pelvis or left hip. ? ?SELF- REPORTED FUNCTION ?FOTO score: 42/100 (Upper leg questionnaire) ? ?OBSERVATION/INSPECTION ?Posture ?  Posture (standing): minimal weight bearing on L LE ?Anthropometrics ?Tremor: none ?Body composition: BMI 29.2 ?Muscle bulk: decreased at left quads compared to right. L calf larger than R calf per observation.  ?Skin: appears WFL where visualized.  ?Edema: none ?Functional Mobility ?Bed mobility: sit <> supine and rolling mod I for increased time to move L LE.  ?Transfers: sit <> stand mod I with need for use of B UE (unable to stand without use of B UE).  ?Gait: ambulates with SPC in R LE with antalgic gait favoring L LE and minimizing weight bearing through L LE.  ? ?SPINE MOTION ?LUMBAR SPINE AROM ?*Indicates pain ?Flexion: fingers to shins, decreased leg pain  ?Extension: 25% feels comfortable ?Side Flexion:  ? R WFL ? L 75% pain in R knee ?Rotation:  ?R WFL no increased pain ?L 50% no increased pain ? ?NEUROLOGICAL ?Upper Motor Neuron Screen ?Hoffman's and Clonus (ankle) negative bilaterally.  ?Dermatomes ?L3-S2 appears equal and intact to light touch. ?Deep Tendon Reflexes ?R/L  ?0+/0+ Quadriceps reflex (L4) ?0+/0+ Achilles reflex (S1) ? ? ?PERIPHERAL JOINT MOTION (in degrees) ?PASSIVE RANGE OF MOTION (PROM) ?Comments: B LE WFL except restricted L hip IR and mildly painful at lateral hip  ? ?MUSCLE PERFORMANCE (MMT):  ?*Indicates pain 07/05/21 Date Date  ?Joint/Motion R/L R/L R/L  ?Hip     ?Flexion (L1, L2) 4/3 / /  ?Extension (knee ext) 4+/4- / /  ?Abduction 5/4 / /  ?Knee     ?Extension (L3) 5/2 / /  ?Flexion (S2) 5/5 / /  ?Ankle/Foot     ?Dorsiflexion (L4) 5/4+ / /  ?Great toe extension (L5) 5/5 / /  ?Eversion (S1) 5/5 / /  ?Plantarflexion (S1) 4+/4+ / /  ?Comments:   ? ?SPECIAL TESTS: ?LOWER LIMB NEURODYNAMIC TESTS ?Straight Leg Raise (Sciatic nerve) ? R  = negative ? L  = negative ? ?HIP SPECIAL TESTS ?FABER: R = negative, L = knee pain. ? ?ACCESSORY MOTION: ?No reprod

## 2021-07-02 ENCOUNTER — Other Ambulatory Visit: Payer: Self-pay | Admitting: Physician Assistant

## 2021-07-02 DIAGNOSIS — M4807 Spinal stenosis, lumbosacral region: Secondary | ICD-10-CM

## 2021-07-02 DIAGNOSIS — I639 Cerebral infarction, unspecified: Secondary | ICD-10-CM

## 2021-07-02 DIAGNOSIS — R262 Difficulty in walking, not elsewhere classified: Secondary | ICD-10-CM

## 2021-07-02 DIAGNOSIS — R29898 Other symptoms and signs involving the musculoskeletal system: Secondary | ICD-10-CM

## 2021-07-02 DIAGNOSIS — M79605 Pain in left leg: Secondary | ICD-10-CM

## 2021-07-05 ENCOUNTER — Encounter: Payer: Self-pay | Admitting: Physical Therapy

## 2021-07-05 ENCOUNTER — Ambulatory Visit: Payer: 59 | Attending: Physician Assistant | Admitting: Physical Therapy

## 2021-07-05 DIAGNOSIS — M6281 Muscle weakness (generalized): Secondary | ICD-10-CM | POA: Insufficient documentation

## 2021-07-05 DIAGNOSIS — R262 Difficulty in walking, not elsewhere classified: Secondary | ICD-10-CM | POA: Diagnosis present

## 2021-07-05 DIAGNOSIS — M79605 Pain in left leg: Secondary | ICD-10-CM | POA: Diagnosis present

## 2021-07-05 DIAGNOSIS — Z9181 History of falling: Secondary | ICD-10-CM | POA: Insufficient documentation

## 2021-07-05 NOTE — Therapy (Signed)
OUTPATIENT PHYSICAL THERAPY TREATMENT NOTE   Patient Name: Shane Dunn MRN: [id number] DOB:1965-07-31, 56 y.o., male Today's Date: 07/12/2021  PCP: [place] REFERRING PROVIDER: Luella Cook, PA-C [facility] Neurology)  END OF SESSION:   PT End of Session - 07/12/21 2039     Visit Number 2    Number of Visits 24    Date for PT Re-Evaluation 12/20/21    Authorization Type [place] reporting from 07/05/2021    Authorization Time Period VL 30 PT/OT/Chiro    Authorization - Visit Number 2    Authorization - Number of Visits 30    Progress Note Due on Visit 10    PT Start Time 1120    PT Stop Time 1200    PT Time Calculation (min) 40 min    Activity Tolerance Patient tolerated treatment well;No increased pain    Behavior During Therapy [facility] for tasks assessed/performed             History reviewed. No pertinent past medical history. History reviewed. No pertinent surgical history. There are no problems to display for this patient.   REFERRING DIAG: leg weakness, difficulty walking  THERAPY DIAG:  Muscle weakness (generalized)  Pain in left leg  Difficulty in walking, not elsewhere classified  History of falling  Rationale for Evaluation and Treatment: Rehabilitation  ONSET DATE: 04/25/2021  PERTINENT HISTORY: Patient is a 56 y.o. male who presents to outpatient physical therapy with a referral for medical diagnosis leg weakness, difficulty walking. This patient's chief complaints consist of sudden onset left lower leg pain, possibly paresthesia, and L LE weakness resulting in falls and leading to the following functional deficits: difficulty with usual physical function and basic mobility such as bed mobility, transfers, community and household ambulation, working, lifting, carrying, bending, jogging, running. 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  Patient denies hx of cancer, stroke, seizures, lung problems, heart problems, unexplained weight loss, unexplained changes in bowel or bladder problems, unexplained stumbling or dropping things, and spinal surgery  PRECAUTIONS:  Other: not allowed to eat sugar, salt, and sweet drinks.  SUBJECTIVE: Patient reports he is feeling well and is feeling better. He states he is having less pain that before. He arrives with a SPC and locks his right knee during stance phase of gait. His mother [name] brought him to the waiting room. He states he was a little sore after last PT session.   PAIN:  Are you having pain? Yes: NPRS scale: Current: 4/10 (used pictures of faces)    OBJECTIVE:   Vitals:   07/12/21 1126  BP: 130/85  Pulse: 85  SpO2: 100%    TODAY'S TREATMENT  Therapeutic exercise: to centralize symptoms and improve ROM, strength, muscular endurance, and activity tolerance required for successful completion of functional activities.  - vitals check to get baseline prior to exercise - NuStep level 5 using bilateral upper and lower extremities. Seat/handle setting 15/15. For improved extremity mobility, muscular endurance, and activity tolerance; and to induce the analgesic effect of aerobic exercise, stimulate improved joint nutrition, and prepare body structures and systems for following interventions. x 6  minutes. Average SPM = 89. - standing marching with BUE support, 3x10 each side (able to get good R hip ROM).   SEATED ON OMEGA KNEE EXTENSION MACHINE - R knee extension 1x10 at 5# - L knee extension attempt (unable and patient crosses legs to use R to  support L).   MODALITY applied during therex:  - RUSSIAN NMES to left quad via two 4 inch oval pads:  Single channel, CC, cycle time 5/5, burst frequency 50 bps, duty cycle 50%, ramp 5 seconds, anti-fatigue off, intensity to patient tolerance (up to 75 mA).  Patient performed left knee extension kicks with encouragement from PT to  extend L knee during on cycle. Modality running for 7 min with good tolerance.   - pew rocking mini squat with plinth at bilateral posterior knees, 3x10  Pt required multimodal cuing for proper technique and to facilitate improved neuromuscular control, strength, range of motion, and functional ability resulting in improved performance and form.  PATIENT EDUCATION:  Education details: Exercise purpose/form. Self management techniques.  Person educated: Patient Education method: Explanation, demonstration, verbal, visual, and tactile cuing Education comprehension: verbalized understanding, returned demonstration, and needs further education     HOME EXERCISE PROGRAM: TBD   ASSESSMENT:   CLINICAL IMPRESSION: Patient tolerated treatment well overall with no increased pain by end of session. Patient's left quad continues to have minimal contraction and patient needed a lot of cuing and practice to be able to give full effort for left knee extension. Patient required high level of cuing due to cognitive limitations but participated well. Plan to continue interventions for L quad and LE strengthening as tolerated next session. Patient would benefit from continued management of limiting condition by skilled physical therapist to address remaining impairments and functional limitations to work towards stated goals and return to PLOF or maximal functional independence.   Patient is a 56 y.o. male referred to outpatient physical therapy with a medical diagnosis of leg weakness, difficulty walking who presents with signs and symptoms consistent with left LE weakness, unsteady on feet, abnormal gait. Patient's weakness is most profound in left knee extension followed by L hip flexion. He also has weakness of other hip motions that are not as prominent. Unable to identify lumbar source of pain and testing for upper motor neuron injury was negative. Unclear why patient experienced sudden weakness or why it  has not been restored at this point. Patient does have difficulty with communication and has cognitive differences related to his mother's information that he is likely on the autism spectrum, which hinders communication about symptoms during testing. Patient presents with significant paresthesia?, AROM, gait, balance, posture, muscle performance (strength/power/endurance) and activity tolerance impairments that are limiting ability to complete his usual physical function and basic mobility such as bed mobility, transfers, community and household ambulation, working, lifting, carrying, bending, jogging, running without difficulty. Patient will benefit from skilled physical therapy intervention to address current body structure impairments and activity limitations to improve function and work towards goals set in current POC in order to return to prior level of function or maximal functional improvement.    OBJECTIVE IMPAIRMENTS Abnormal gait, decreased activity tolerance, decreased balance, decreased coordination, decreased endurance, decreased knowledge of condition, decreased mobility, difficulty walking, decreased ROM, decreased strength, impaired perceived functional ability, impaired sensation, impaired tone, and improper body mechanics.    ACTIVITY LIMITATIONS community activity, occupation, and   usual physical function and basic mobility such as bed mobility, transfers, community and household ambulation, working, lifting, carrying, bending, jogging, running.    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      REHAB POTENTIAL: Good  CLINICAL DECISION MAKING: Evolving/moderate complexity   EVALUATION COMPLEXITY: Moderate     GOALS: Goals reviewed with  patient? No   SHORT TERM GOALS: Target date: 07/19/2021   Patient will be independent with initial home exercise program for self-management of symptoms. Baseline: Initial HEP to be provided at visit 2 as appropriate (07/05/21); Goal status: In-progress     LONG TERM GOALS: Target date: 09/27/2021   Patient will be independent with a long-term home exercise program for self-management of symptoms.  Baseline: Initial HEP to be provided at visit 2 as appropriate (07/05/21); Goal status: In-progress   2.  Patient will demonstrate improved FOTO to equal or greater than 67 by visit #14 to demonstrate improvement in overall condition and self-reported functional ability.  Baseline: 42 (07/05/21); Goal status: In-progress   3.  Patient will demonstrate L hip and knee MMT equal or greater to R hip and knee MMT to demonstrate improved strength for functional activities such as walking and working.  Baseline: lacking strength on left LE - see objective (07/05/21); Goal status: In-progress   4.  Patient will ambulate equal or greater than 1000 feet during 6 Minute Walk Test with no assistive device and normal gait pattern to demonstrate improved community and household ambulation and decreased fall risk.  Baseline: ambulates with SPC in R LE with antalgic gait favoring L LE and minimizing weight bearing through L LE.  (07/05/21); Goal status: In-progress   5.  Patient will complete community, work and/or recreational activities without limitation due to current condition.  Baseline: difficulty usual physical function and basic mobility such as bed mobility, transfers, community and household ambulation, working, lifting, carrying, bending, jogging, running (07/05/21); Goal status: In-progress   PLAN: PT FREQUENCY: 2x/week   PT DURATION: 12 weeks   PLANNED INTERVENTIONS: Therapeutic exercises, Therapeutic activity, Neuromuscular re-education, Balance training, Gait training, Patient/Family  education, Joint mobilization, Stair training, DME instructions, Dry Needling, Electrical stimulation, Spinal mobilization, Cryotherapy, Moist heat, Manual therapy, and Re-evaluation.   PLAN FOR NEXT SESSION: Update HEP as appropriate, progressive LE strengthening and balance as tolerated.    Everlean Alstrom. Graylon Good, PT, DPT 07/12/21, 8:52 PM  [facility] [address] [place], [place] [address] P: [phone] I F: [phone]

## 2021-07-12 ENCOUNTER — Encounter: Payer: Self-pay | Admitting: Physical Therapy

## 2021-07-12 ENCOUNTER — Ambulatory Visit: Payer: 59 | Admitting: Physical Therapy

## 2021-07-12 VITALS — BP 130/85 | HR 85

## 2021-07-12 DIAGNOSIS — M6281 Muscle weakness (generalized): Secondary | ICD-10-CM

## 2021-07-12 DIAGNOSIS — R262 Difficulty in walking, not elsewhere classified: Secondary | ICD-10-CM

## 2021-07-12 DIAGNOSIS — Z9181 History of falling: Secondary | ICD-10-CM

## 2021-07-12 DIAGNOSIS — M79605 Pain in left leg: Secondary | ICD-10-CM

## 2021-07-13 NOTE — Therapy (Signed)
OUTPATIENT PHYSICAL THERAPY TREATMENT NOTE   Patient Name: Shane Dunn MRN: [id number] DOB:1965-09-17, 56 y.o., male Today's Date: 07/14/2021  PCP: [place] REFERRING PROVIDER: Luella Cook, PA-C [facility] Neurology)  END OF SESSION:   PT End of Session - 07/14/21 1551     Visit Number 3    Number of Visits 24    Date for PT Re-Evaluation 12/20/21    Authorization Type [place] reporting from 07/05/2021    Authorization Time Period VL 30 PT/OT/Chiro    Authorization - Visit Number 3    Authorization - Number of Visits 30    Progress Note Due on Visit 10    PT Start Time 1430    PT Stop Time 1515    PT Time Calculation (min) 45 min    Activity Tolerance Patient tolerated treatment well;No increased pain    Behavior During Therapy [facility] for tasks assessed/performed              History reviewed. No pertinent past medical history. History reviewed. No pertinent surgical history. There are no problems to display for this patient.   REFERRING DIAG: leg weakness, difficulty walking  THERAPY DIAG:  Muscle weakness (generalized)  Pain in left leg  Difficulty in walking, not elsewhere classified  History of falling  Rationale for Evaluation and Treatment: Rehabilitation  ONSET DATE: 04/25/2021  PERTINENT HISTORY: Patient is a 56 y.o. male who presents to outpatient physical therapy with a referral for medical diagnosis leg weakness, difficulty walking. This patient's chief complaints consist of sudden onset left lower leg pain, possibly paresthesia, and L LE weakness resulting in falls and leading to the following functional deficits: difficulty with usual physical function and basic mobility such as bed mobility, transfers, community and household ambulation, working, lifting, carrying, bending, jogging, running. 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  Patient denies hx of cancer, stroke, seizures, lung problems, heart problems, unexplained weight loss, unexplained changes in bowel or bladder problems, unexplained stumbling or dropping things, and spinal surgery  PRECAUTIONS:  Other: not allowed to eat sugar, salt, and sweet drinks.  SUBJECTIVE: Patient reports he his feeling "awesome" today but his left leg is the same situation as last time. He states he was not sore after last PT session. Patient requests PT speak with mother [name] at end of session and she said she got a denial letter for MRI but then an approval letter. They see the doctor again on 07/27/2021 and plan to discuss if he can get MRI at that time.   PAIN:  Are you having pain? Yes: NPRS scale: Current: 4/10 (used pictures of faces)    OBJECTIVE:    FUNCTIONAL/BALANCE TESTS 6 Minute Walk Test: 843 feet with SPC in R hand, locking left knee for stability, one stumble and patient recovered without falling or physical assist form PT. SBA provided for safety.   TODAY'S TREATMENT  Therapeutic exercise: to centralize symptoms and improve ROM, strength, muscular endurance, and activity tolerance required for successful completion of functional activities.  - ambulation around the clinic for distance in 6 min using SPC (see 6MWT above).  - leg press with single leg, 3x10 L and 2x10 right. Min A to help control plate and L knee/foot for safety. 20#.  - sit <> stand with TRX assist, R foot slightly forward of left, CGA-min A from PT, 3x10 (attempted with half small spike-ball under R foot  to decrease use of R LE with inadequate result).  - standing on L LE with slight knee flexion while moving R foot up and down from edge of treadmill (8.5 inches), 1x10. PT guarding and facilitating at left knee and patient encouraged to put less weight on TM bars with B UE support.  - reverse "deadmill" one set to [place], then 2x1 min. (Good left quad contraction VISIBLE).   Pt required  multimodal cuing for proper technique and to facilitate improved neuromuscular control, strength, range of motion, and functional ability resulting in improved performance and form.  PATIENT EDUCATION:  Education details: Exercise purpose/form. Self management techniques. MRI Person educated: Patient and mother Education method: Explanation, demonstration, verbal, visual, and tactile cuing Education comprehension: verbalized understanding, returned demonstration, and needs further education     HOME EXERCISE PROGRAM: TBD   ASSESSMENT:   CLINICAL IMPRESSION: Patient tolerated treatment well with no increase in pain by end of session. Visit focused on exercises that elicited as strong a contraction as possible in left quad. Best contraction noted during reverse "deadmill" walking. Plan to continue working on exercises to stimulate L quad contraction as appropriate. Patient would benefit from continued management of limiting condition by skilled physical therapist to address remaining impairments and functional limitations to work towards stated goals and return to PLOF or maximal functional independence.   Patient is a 56 y.o. male referred to outpatient physical therapy with a medical diagnosis of leg weakness, difficulty walking who presents with signs and symptoms consistent with left LE weakness, unsteady on feet, abnormal gait. Patient's weakness is most profound in left knee extension followed by L hip flexion. He also has weakness of other hip motions that are not as prominent. Unable to identify lumbar source of pain and testing for upper motor neuron injury was negative. Unclear why patient experienced sudden weakness or why it has not been restored at this point. Patient does have difficulty with communication and has cognitive differences related to his mother's information that he is likely on the autism spectrum, which hinders communication about symptoms during testing. Patient presents  with significant paresthesia?, AROM, gait, balance, posture, muscle performance (strength/power/endurance) and activity tolerance impairments that are limiting ability to complete his usual physical function and basic mobility such as bed mobility, transfers, community and household ambulation, working, lifting, carrying, bending, jogging, running without difficulty. Patient will benefit from skilled physical therapy intervention to address current body structure impairments and activity limitations to improve function and work towards goals set in current POC in order to return to prior level of function or maximal functional improvement.    OBJECTIVE IMPAIRMENTS Abnormal gait, decreased activity tolerance, decreased balance, decreased coordination, decreased endurance, decreased knowledge of condition, decreased mobility, difficulty walking, decreased ROM, decreased strength, impaired perceived functional ability, impaired sensation, impaired tone, and improper body mechanics.    ACTIVITY LIMITATIONS community activity, occupation, and   usual physical function and basic mobility such as bed mobility, transfers, community and household ambulation, working, lifting, carrying, bending, jogging, running.    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      REHAB POTENTIAL: Good   CLINICAL DECISION MAKING: Evolving/moderate complexity   EVALUATION COMPLEXITY: Moderate     GOALS: Goals reviewed with patient? No   SHORT TERM GOALS: Target date: 07/19/2021   Patient will be independent with  initial home exercise program for self-management of symptoms. Baseline: Initial HEP to be provided at visit 2 as appropriate (07/05/21); Goal status: In-progress     LONG TERM  GOALS: Target date: 09/27/2021   Patient will be independent with a long-term home exercise program for self-management of symptoms.  Baseline: Initial HEP to be provided at visit 2 as appropriate (07/05/21); Goal status: In-progress   2.  Patient will demonstrate improved FOTO to equal or greater than 67 by visit #14 to demonstrate improvement in overall condition and self-reported functional ability.  Baseline: 42 (07/05/21); Goal status: In-progress   3.  Patient will demonstrate L hip and knee MMT equal or greater to R hip and knee MMT to demonstrate improved strength for functional activities such as walking and working.  Baseline: lacking strength on left LE - see objective (07/05/21); Goal status: In-progress   4.  Patient will ambulate equal or greater than 1000 feet during 6 Minute Walk Test with no assistive device and normal gait pattern to demonstrate improved community and household ambulation and decreased fall risk.  Baseline: ambulates with SPC in R LE with antalgic gait favoring L LE and minimizing weight bearing through L LE.  (07/05/21);  843 feet with SPC in R hand, locking left knee for stability, one stumble and patient recovered without falling or physical assist form PT. SBA provided for safety (07/14/2021);  Goal status: In-progress   5.  Patient will complete community, work and/or recreational activities without limitation due to current condition.  Baseline: difficulty usual physical function and basic mobility such as bed mobility, transfers, community and household ambulation, working, lifting, carrying, bending, jogging, running (07/05/21); Goal status: In-progress   PLAN: PT FREQUENCY: 2x/week   PT DURATION: 12 weeks   PLANNED INTERVENTIONS: Therapeutic exercises, Therapeutic activity, Neuromuscular re-education, Balance training, Gait training, Patient/Family education, Joint mobilization, Stair training, DME instructions, Dry Needling, Electrical stimulation,  Spinal mobilization, Cryotherapy, Moist heat, Manual therapy, and Re-evaluation.   PLAN FOR NEXT SESSION: Update HEP as appropriate, progressive LE strengthening and balance as tolerated.    Everlean Alstrom. Graylon Good, PT, DPT 07/14/21, 3:53 PM  [facility] [address] [place], [place] [address] P: [phone] I F: [phone]

## 2021-07-14 ENCOUNTER — Ambulatory Visit: Payer: 59 | Admitting: Physical Therapy

## 2021-07-14 ENCOUNTER — Encounter: Payer: Self-pay | Admitting: Physical Therapy

## 2021-07-14 DIAGNOSIS — R262 Difficulty in walking, not elsewhere classified: Secondary | ICD-10-CM

## 2021-07-14 DIAGNOSIS — Z9181 History of falling: Secondary | ICD-10-CM

## 2021-07-14 DIAGNOSIS — M79605 Pain in left leg: Secondary | ICD-10-CM

## 2021-07-14 DIAGNOSIS — M6281 Muscle weakness (generalized): Secondary | ICD-10-CM | POA: Diagnosis not present

## 2021-07-21 ENCOUNTER — Ambulatory Visit: Payer: 59 | Admitting: Physical Therapy

## 2021-07-21 ENCOUNTER — Encounter: Payer: Self-pay | Admitting: Physical Therapy

## 2021-07-21 DIAGNOSIS — M6281 Muscle weakness (generalized): Secondary | ICD-10-CM | POA: Diagnosis not present

## 2021-07-21 DIAGNOSIS — M79605 Pain in left leg: Secondary | ICD-10-CM

## 2021-07-21 DIAGNOSIS — R262 Difficulty in walking, not elsewhere classified: Secondary | ICD-10-CM

## 2021-07-21 DIAGNOSIS — Z9181 History of falling: Secondary | ICD-10-CM

## 2021-07-21 NOTE — Therapy (Signed)
OUTPATIENT PHYSICAL THERAPY TREATMENT NOTE   Patient Name: Shane Dunn MRN: [id number] DOB:1965/09/09, 56 y.o., male Today's Date: 07/21/2021  PCP: [place] REFERRING PROVIDER: Luella Cook, PA-C [facility] Neurology)  END OF SESSION:   PT End of Session - 07/21/21 1306     Visit Number 4    Number of Visits 24    Date for PT Re-Evaluation 12/20/21    Authorization Type [place] reporting from 07/05/2021    Authorization Time Period VL 30 PT/OT/Chiro    Authorization - Visit Number 4    Authorization - Number of Visits 30    Progress Note Due on Visit 10    PT Start Time 1300    PT Stop Time 1340    PT Time Calculation (min) 40 min    Activity Tolerance Patient tolerated treatment well;No increased pain    Behavior During Therapy [facility] for tasks assessed/performed               History reviewed. No pertinent past medical history. History reviewed. No pertinent surgical history. There are no problems to display for this patient.   REFERRING DIAG: leg weakness, difficulty walking  THERAPY DIAG:  Muscle weakness (generalized)  Pain in left leg  Difficulty in walking, not elsewhere classified  History of falling  Rationale for Evaluation and Treatment: Rehabilitation  ONSET DATE: 04/25/2021  PERTINENT HISTORY: Patient is a 56 y.o. male who presents to outpatient physical therapy with a referral for medical diagnosis leg weakness, difficulty walking. This patient's chief complaints consist of sudden onset left lower leg pain, possibly paresthesia, and L LE weakness resulting in falls and leading to the following functional deficits: difficulty with usual physical function and basic mobility such as bed mobility, transfers, community and household ambulation, working, lifting, carrying, bending, jogging, running. 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  Patient denies hx of cancer, stroke, seizures, lung problems, heart problems, unexplained weight loss, unexplained changes in bowel or bladder problems, unexplained stumbling or dropping things, and spinal surgery  PRECAUTIONS:  Other: not allowed to eat sugar, salt, and sweet drinks.  SUBJECTIVE: Patient arrives with his SPC. He reports he is feeling pretty good and his having a "cook-in" for his birthday today. States he has a little pain in his left leg that he rates as 5/10. He states he has not had any falls since last PT session and felt okay after last PT session.   PAIN:  Are you having pain? Yes: NPRS scale: Current: 5/10 (used pictures of faces)    OBJECTIVE:   TODAY'S TREATMENT  Therapeutic exercise: to centralize symptoms and improve ROM, strength, muscular endurance, and activity tolerance required for successful completion of functional activities.  - NuStep level 6 (attempted level 7 but too difficult) using bilateral lower extremities. Seat/handle setting 15. For improved extremity mobility, muscular endurance, and activity tolerance; and to induce the analgesic effect of aerobic exercise, stimulate improved joint nutrition, and prepare body structures and systems for following interventions. x 5  minutes. Average SPM = 83. - leg press with single leg, 3x10 L and 2x10 right. Min A to help control plate and L knee/foot for safety. 20#. - reverse "deadmill" one set to [place], then 2x2.5 min.  - standing on L LE with slight knee flexion while moving R foot up and down from edge of treadmill (8.5 inches), 3x10. PT guarding and facilitating at left knee  and patient encouraged to put less weight on TM bars with B UE support (more successful during last set).  - seated light ball toss towards left foot and kicking ball back and forth to PT on floor or in the air to improve left quad activation and speed.   Pt required multimodal cuing for proper technique and to facilitate  improved neuromuscular control, strength, range of motion, and functional ability resulting in improved performance and form. Guarding and cuing needed for safety.   PATIENT EDUCATION:  Education details: Exercise purpose/form. Self management techniques. Person educated: Patient and mother Education method: Explanation, demonstration, verbal, visual, and tactile cuing Education comprehension: verbalized understanding, returned demonstration, and needs further education     HOME EXERCISE PROGRAM: TBD   ASSESSMENT:   CLINICAL IMPRESSION: Patient tolerated treatment well with no increase in pain by end of session. Continued focus on activating/strengthening L quads that remain profoundly weak. Patient gives good effort and requires cuing and guarding for safety and for maximal benefit from exercises. Minimal contraction noted in L quads with palpation and visual inspection. Plan to continue working on improving L quad strength next session as appropriate. Patient would benefit from continued management of limiting condition by skilled physical therapist to address remaining impairments and functional limitations to work towards stated goals and return to PLOF or maximal functional independence.   Patient is a 56 y.o. male referred to outpatient physical therapy with a medical diagnosis of leg weakness, difficulty walking who presents with signs and symptoms consistent with left LE weakness, unsteady on feet, abnormal gait. Patient's weakness is most profound in left knee extension followed by L hip flexion. He also has weakness of other hip motions that are not as prominent. Unable to identify lumbar source of pain and testing for upper motor neuron injury was negative. Unclear why patient experienced sudden weakness or why it has not been restored at this point. Patient does have difficulty with communication and has cognitive differences related to his mother's information that he is likely on the  autism spectrum, which hinders communication about symptoms during testing. Patient presents with significant paresthesia?, AROM, gait, balance, posture, muscle performance (strength/power/endurance) and activity tolerance impairments that are limiting ability to complete his usual physical function and basic mobility such as bed mobility, transfers, community and household ambulation, working, lifting, carrying, bending, jogging, running without difficulty. Patient will benefit from skilled physical therapy intervention to address current body structure impairments and activity limitations to improve function and work towards goals set in current POC in order to return to prior level of function or maximal functional improvement.    OBJECTIVE IMPAIRMENTS Abnormal gait, decreased activity tolerance, decreased balance, decreased coordination, decreased endurance, decreased knowledge of condition, decreased mobility, difficulty walking, decreased ROM, decreased strength, impaired perceived functional ability, impaired sensation, impaired tone, and improper body mechanics.    ACTIVITY LIMITATIONS community activity, occupation, and   usual physical function and basic mobility such as bed mobility, transfers, community and household ambulation, working, lifting, carrying, bending, jogging, running.    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      REHAB POTENTIAL: Good   CLINICAL DECISION MAKING: Evolving/moderate complexity   EVALUATION COMPLEXITY: Moderate     GOALS: Goals reviewed with patient? No   SHORT TERM GOALS: Target date: 07/19/2021   Patient will be independent  with initial home exercise program for self-management of symptoms. Baseline: Initial HEP to  be provided at visit 2 as appropriate (07/05/21); Goal status: In-progress     LONG TERM GOALS: Target date: 09/27/2021   Patient will be independent with a long-term home exercise program for self-management of symptoms.  Baseline: Initial HEP to be provided at visit 2 as appropriate (07/05/21); Goal status: In-progress   2.  Patient will demonstrate improved FOTO to equal or greater than 67 by visit #14 to demonstrate improvement in overall condition and self-reported functional ability.  Baseline: 42 (07/05/21); Goal status: In-progress   3.  Patient will demonstrate L hip and knee MMT equal or greater to R hip and knee MMT to demonstrate improved strength for functional activities such as walking and working.  Baseline: lacking strength on left LE - see objective (07/05/21); Goal status: In-progress   4.  Patient will ambulate equal or greater than 1000 feet during 6 Minute Walk Test with no assistive device and normal gait pattern to demonstrate improved community and household ambulation and decreased fall risk.  Baseline: ambulates with SPC in R LE with antalgic gait favoring L LE and minimizing weight bearing through L LE.  (07/05/21);  843 feet with SPC in R hand, locking left knee for stability, one stumble and patient recovered without falling or physical assist form PT. SBA provided for safety (07/14/2021);  Goal status: In-progress   5.  Patient will complete community, work and/or recreational activities without limitation due to current condition.  Baseline: difficulty usual physical function and basic mobility such as bed mobility, transfers, community and household ambulation, working, lifting, carrying, bending, jogging, running (07/05/21); Goal status: In-progress   PLAN: PT FREQUENCY: 2x/week   PT DURATION: 12 weeks   PLANNED INTERVENTIONS: Therapeutic exercises, Therapeutic activity, Neuromuscular re-education, Balance training, Gait training, Patient/Family  education, Joint mobilization, Stair training, DME instructions, Dry Needling, Electrical stimulation, Spinal mobilization, Cryotherapy, Moist heat, Manual therapy, and Re-evaluation.   PLAN FOR NEXT SESSION: Update HEP as appropriate, progressive LE strengthening and balance as tolerated.    Everlean Alstrom. Graylon Good, PT, DPT 07/21/21, 8:22 PM  [facility] [address] [place], [place] [address] P: [phone] I F: [phone]

## 2021-07-22 NOTE — Therapy (Signed)
OUTPATIENT PHYSICAL THERAPY TREATMENT NOTE   Patient Name: Shane Dunn MRN: [id number] DOB:09/06/1965, 56 y.o., male Today's Date: 07/26/2021  PCP: [place] REFERRING PROVIDER: Luella Cook, PA-C [facility] Neurology)  END OF SESSION:   PT End of Session - 07/26/21 1125     Visit Number 5    Number of Visits 24    Date for PT Re-Evaluation 12/20/21    Authorization Type [place] reporting from 07/05/2021    Authorization Time Period VL 30 PT/OT/Chiro    Authorization - Visit Number 5    Authorization - Number of Visits 30    Progress Note Due on Visit 10    PT Start Time 1118    PT Stop Time 1158    PT Time Calculation (min) 40 min    Activity Tolerance Patient tolerated treatment well;No increased pain    Behavior During Therapy [facility] for tasks assessed/performed                History reviewed. No pertinent past medical history. History reviewed. No pertinent surgical history. There are no problems to display for this patient.   REFERRING DIAG: leg weakness, difficulty walking  THERAPY DIAG:  Muscle weakness (generalized)  Pain in left leg  Difficulty in walking, not elsewhere classified  History of falling  Rationale for Evaluation and Treatment: Rehabilitation  ONSET DATE: 04/25/2021  PERTINENT HISTORY: Patient is a 56 y.o. male who presents to outpatient physical therapy with a referral for medical diagnosis leg weakness, difficulty walking. This patient's chief complaints consist of sudden onset left lower leg pain, possibly paresthesia, and L LE weakness resulting in falls and leading to the following functional deficits: difficulty with usual physical function and basic mobility such as bed mobility, transfers, community and household ambulation, working, lifting, carrying, bending, jogging, running. 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  Patient denies hx of cancer, stroke, seizures, lung problems, heart problems, unexplained weight loss, unexplained changes in bowel or bladder problems, unexplained stumbling or dropping things, and spinal surgery  PRECAUTIONS:  Other: not allowed to eat sugar, salt, and sweet drinks.  SUBJECTIVE: Patient arrives with his SPC. He initially states he has no pain in his left LE then later states it went down to 4/10 last night. He felt okay after last PT session.   PAIN:  Are you having pain? Yes: NPRS scale: Current: 5/10 (used pictures of faces)    OBJECTIVE  TODAY'S TREATMENT  Therapeutic exercise: to centralize symptoms and improve ROM, strength, muscular endurance, and activity tolerance required for successful completion of functional activities.  - reverse "deadmill" 3x2 min.  - leg press with single leg, 3x10 L and 2x10 right. Min A to help control plate and L knee/foot for safety. 20#. - standing on L LE with slight knee flexion while moving R foot up and down from edge of treadmill (8.5 inches), 3x10. PT guarding and facilitating at left knee and patient encouraged to put less weight on TM bars with B UE support (more successful during last set).  - Single leg squat on TOTAL GYM at level 4, 3x10 each side.  - sit <> stand from 18 inch chair with as little use of UE possible, 1x10 - seated L long arc quad kick 1x10 - Education on HEP including handout   Pt required multimodal cuing for proper technique and to facilitate improved neuromuscular control, strength, range of motion,  and functional ability resulting in improved performance and form. Guarding and cuing needed for safety.   PATIENT EDUCATION:  Education details: Exercise purpose/form. Self management techniques. Person educated: Patient and mother Education method: Explanation, demonstration, verbal, visual, and tactile cuing Education comprehension: verbalized understanding, returned demonstration, and  needs further education     HOME EXERCISE PROGRAM: Access Code: [email or web address] URL: [email or web address] Date: 07/26/2021 Prepared by: Rosita Kea  Exercises - Sit to Stand Without Arm Support  - 1 x daily - 3 sets - 10 reps - Seated Long Arc Quad  - 1 x daily - 3 sets - 10 reps   ASSESSMENT:   CLINICAL IMPRESSION: Patient tolerated treatment well with no increased pain. Session continued to focus on L quad activation/strength. Patient demonstrates improved quad activation at start of session and first 5 reps of each exercise set. Noted for popping in L knee with some movements. Plan to continue with focus on L quad strength as appropriate next session. Patient would benefit from continued management of limiting condition by skilled physical therapist to address remaining impairments and functional limitations to work towards stated goals and return to PLOF or maximal functional independence.   Patient is a 56 y.o. male referred to outpatient physical therapy with a medical diagnosis of leg weakness, difficulty walking who presents with signs and symptoms consistent with left LE weakness, unsteady on feet, abnormal gait. Patient's weakness is most profound in left knee extension followed by L hip flexion. He also has weakness of other hip motions that are not as prominent. Unable to identify lumbar source of pain and testing for upper motor neuron injury was negative. Unclear why patient experienced sudden weakness or why it has not been restored at this point. Patient does have difficulty with communication and has cognitive differences related to his mother's information that he is likely on the autism spectrum, which hinders communication about symptoms during testing. Patient presents with significant paresthesia?, AROM, gait, balance, posture, muscle performance (strength/power/endurance) and activity tolerance impairments that are limiting ability to complete his usual physical  function and basic mobility such as bed mobility, transfers, community and household ambulation, working, lifting, carrying, bending, jogging, running without difficulty. Patient will benefit from skilled physical therapy intervention to address current body structure impairments and activity limitations to improve function and work towards goals set in current POC in order to return to prior level of function or maximal functional improvement.    OBJECTIVE IMPAIRMENTS Abnormal gait, decreased activity tolerance, decreased balance, decreased coordination, decreased endurance, decreased knowledge of condition, decreased mobility, difficulty walking, decreased ROM, decreased strength, impaired perceived functional ability, impaired sensation, impaired tone, and improper body mechanics.    ACTIVITY LIMITATIONS community activity, occupation, and   usual physical function and basic mobility such as bed mobility, transfers, community and household ambulation, working, lifting, carrying, bending, jogging, running.    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      REHAB POTENTIAL: Good   CLINICAL DECISION MAKING: Evolving/moderate complexity   EVALUATION COMPLEXITY: Moderate     GOALS: Goals reviewed with patient? No   SHORT TERM GOALS: Target date: 07/19/2021   Patient will be independent with initial home exercise program for self-management of symptoms. Baseline: Initial HEP to be provided at visit 2 as appropriate (07/05/21); Goal status: In-progress     LONG TERM GOALS:  Target date: 09/27/2021   Patient will be independent with a long-term home exercise program for self-management of symptoms.  Baseline: Initial HEP to be provided at visit 2 as appropriate  (07/05/21); Goal status: In-progress   2.  Patient will demonstrate improved FOTO to equal or greater than 67 by visit #14 to demonstrate improvement in overall condition and self-reported functional ability.  Baseline: 42 (07/05/21); Goal status: In-progress   3.  Patient will demonstrate L hip and knee MMT equal or greater to R hip and knee MMT to demonstrate improved strength for functional activities such as walking and working.  Baseline: lacking strength on left LE - see objective (07/05/21); Goal status: In-progress   4.  Patient will ambulate equal or greater than 1000 feet during 6 Minute Walk Test with no assistive device and normal gait pattern to demonstrate improved community and household ambulation and decreased fall risk.  Baseline: ambulates with SPC in R LE with antalgic gait favoring L LE and minimizing weight bearing through L LE.  (07/05/21);  843 feet with SPC in R hand, locking left knee for stability, one stumble and patient recovered without falling or physical assist form PT. SBA provided for safety (07/14/2021);  Goal status: In-progress   5.  Patient will complete community, work and/or recreational activities without limitation due to current condition.  Baseline: difficulty usual physical function and basic mobility such as bed mobility, transfers, community and household ambulation, working, lifting, carrying, bending, jogging, running (07/05/21); Goal status: In-progress   PLAN: PT FREQUENCY: 2x/week   PT DURATION: 12 weeks   PLANNED INTERVENTIONS: Therapeutic exercises, Therapeutic activity, Neuromuscular re-education, Balance training, Gait training, Patient/Family education, Joint mobilization, Stair training, DME instructions, Dry Needling, Electrical stimulation, Spinal mobilization, Cryotherapy, Moist heat, Manual therapy, and Re-evaluation.   PLAN FOR NEXT SESSION: Update HEP as appropriate, progressive LE strengthening and balance as tolerated.     Everlean Alstrom. Graylon Good, PT, DPT 07/27/21, 11:12 AM  [facility] [address] [place], [place] [address] P: [phone] I F: [phone]

## 2021-07-26 ENCOUNTER — Encounter: Payer: Self-pay | Admitting: Physical Therapy

## 2021-07-26 ENCOUNTER — Ambulatory Visit: Payer: 59 | Attending: Physician Assistant | Admitting: Physical Therapy

## 2021-07-26 DIAGNOSIS — Z9181 History of falling: Secondary | ICD-10-CM | POA: Insufficient documentation

## 2021-07-26 DIAGNOSIS — M79605 Pain in left leg: Secondary | ICD-10-CM | POA: Insufficient documentation

## 2021-07-26 DIAGNOSIS — M6281 Muscle weakness (generalized): Secondary | ICD-10-CM | POA: Insufficient documentation

## 2021-07-26 DIAGNOSIS — R262 Difficulty in walking, not elsewhere classified: Secondary | ICD-10-CM | POA: Insufficient documentation

## 2021-07-27 NOTE — Therapy (Signed)
OUTPATIENT PHYSICAL THERAPY TREATMENT NOTE   Patient Name: Shane Dunn MRN: [id number] DOB:1965-08-13, 56 y.o., male Today's Date: 08/03/21   PCP: [place] REFERRING PROVIDER: Luella Cook, PA-C [facility] Neurology)  END OF SESSION:   PT End of Session - 08/03/21 1307     Visit Number 6    Number of Visits 24    Date for PT Re-Evaluation 12/20/21    Authorization Type [employer] reporting from 07/05/2021    Authorization Time Period VL 30 PT/OT/Chiro    Authorization - Visit Number 6    Authorization - Number of Visits 30    Progress Note Due on Visit 10    PT Start Time [id number]    PT Stop Time 1343    PT Time Calculation (min) 38 min    Activity Tolerance Patient tolerated treatment well;No increased pain    Behavior During Therapy [facility] for tasks assessed/performed                 History reviewed. No pertinent past medical history. History reviewed. No pertinent surgical history. There are no problems to display for this patient.   REFERRING DIAG: leg weakness, difficulty walking  THERAPY DIAG:  Muscle weakness (generalized)  Pain in left leg  Difficulty in walking, not elsewhere classified  History of falling  Rationale for Evaluation and Treatment: Rehabilitation  ONSET DATE: 04/25/2021  PERTINENT HISTORY: Patient is a 56 y.o. male who presents to outpatient physical therapy with a referral for medical diagnosis leg weakness, difficulty walking. This patient's chief complaints consist of sudden onset left lower leg pain, possibly paresthesia, and L LE weakness resulting in falls and leading to the following functional deficits: difficulty with usual physical function and basic mobility such as bed mobility, transfers, community and household ambulation, working, lifting, carrying, bending, jogging, running. 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  Patient denies hx of cancer, stroke, seizures, lung problems, heart problems, unexplained weight loss, unexplained changes in bowel or bladder problems, unexplained stumbling or dropping things, and spinal surgery  PRECAUTIONS:  Other: not allowed to eat sugar, salt, and sweet drinks.  SUBJECTIVE: Patient arrives with his SPC. He states he has no leg pain today and his leg is getting better. He states he saw the doctor about his leg but did not learn anything new. Review of chart shows instructions from neurology to call [facility] to schedule MRI of the brain and lumbar spine without contrast, continue PT for leg weakness and difficulty walking, and that they will consider EMG of the lower extremities following MRI results. He has his MRIs scheduled on Thursday this week.   PAIN:  Are you having pain? no   OBJECTIVE  TODAY'S TREATMENT  Therapeutic exercise: to centralize symptoms and improve ROM, strength, muscular endurance, and activity tolerance required for successful completion of functional activities.  - NuStep level 6 (attempted level 7 but too difficult) using bilateral lower extremities. Seat/handle setting 15. For improved extremity mobility, muscular endurance, and activity tolerance; and to induce the analgesic effect of aerobic exercise, stimulate improved joint nutrition, and prepare body structures and systems for following interventions. x 5  minutes. Average SPM = 100. - reverse "deadmill" 1x2 min.  - seated left LAQ MODALITY applied during therex:  - RUSSIAN NMES to left quad via two 4 inch oval pads:  Single channel, CC, cycle time 5/5, burst frequency 50 bps, duty cycle 50%,  ramp 5 seconds, anti-fatigue off, intensity to patient tolerance (up to 100 mA).  Patient performed left knee extension kicks with encouragement from PT to extend L knee during on cycle. Modality running for 10 min with good tolerance.  - sit <> stand from 18 inch chair with as little use of  UE possible, 1x10  Pt required multimodal cuing for proper technique and to facilitate improved neuromuscular control, strength, range of motion, and functional ability resulting in improved performance and form. Guarding and cuing needed for safety.   PATIENT EDUCATION:  Education details: Exercise purpose/form. Self management techniques. Person educated: Patient and mother Education method: Explanation, demonstration, verbal, visual, and tactile cuing Education comprehension: verbalized understanding, returned demonstration, and needs further education     HOME EXERCISE PROGRAM: Access Code: [email or web address] URL: [email or web address] Date: 07/26/2021 Prepared by: Rosita Kea  Exercises - Sit to Stand Without Arm Support  - 1 x daily - 3 sets - 10 reps - Seated Long Arc Quad  - 1 x daily - 3 sets - 10 reps   ASSESSMENT:   CLINICAL IMPRESSION: Patient tolerated treatment well overall with no increase in pain by end of session. He demo significant fatigue with reverse "deadmill" and nustep exercises. He exhibits excellent effort but also frustration with LAQ with estim exercise where quad activity is visible but he is unable to actively move his lower leg more than up to 30 degrees towards extension. Patient continues to demonstrate conpensatory L knee locking during stance phase that puts abnormal stress on the posterior knee joint capsule and may lead to long term laxity and problems in the future. He is not strong enough to support his weight on L LE without locking the knee. Plan to continue working on L quad strength next session. Patient would benefit from continued management of limiting condition by skilled physical therapist to address remaining impairments and functional limitations to work towards stated goals and return to PLOF or maximal functional independence.   Patient is a 56 y.o. male referred to outpatient physical therapy with a medical diagnosis of leg weakness,  difficulty walking who presents with signs and symptoms consistent with left LE weakness, unsteady on feet, abnormal gait. Patient's weakness is most profound in left knee extension followed by L hip flexion. He also has weakness of other hip motions that are not as prominent. Unable to identify lumbar source of pain and testing for upper motor neuron injury was negative. Unclear why patient experienced sudden weakness or why it has not been restored at this point. Patient does have difficulty with communication and has cognitive differences related to his mother's information that he is likely on the autism spectrum, which hinders communication about symptoms during testing. Patient presents with significant paresthesia?, AROM, gait, balance, posture, muscle performance (strength/power/endurance) and activity tolerance impairments that are limiting ability to complete his usual physical function and basic mobility such as bed mobility, transfers, community and household ambulation, working, lifting, carrying, bending, jogging, running without difficulty. Patient will benefit from skilled physical therapy intervention to address current body structure impairments and activity limitations to improve function and work towards goals set in current POC in order to return to prior level of function or maximal functional improvement.    OBJECTIVE IMPAIRMENTS Abnormal gait, decreased activity tolerance, decreased balance, decreased coordination, decreased endurance, decreased knowledge of condition, decreased mobility, difficulty walking, decreased ROM, decreased strength, impaired perceived functional ability, impaired sensation, impaired tone, and improper body mechanics.    ACTIVITY LIMITATIONS community activity,  occupation, and   usual physical function and basic mobility such as bed mobility, transfers, community and household ambulation, working, lifting, carrying, bending, jogging, running.    PERSONAL  FACTORS Behavior pattern, Past/current experiences, Profession, Time since onset of injury/illness/exacerbation, Transportation, and 3+ comorbidities:   HTN, hyperlipidemia, diabetes, CKD stage 3, likely on autism spectrum are also affecting patient's functional outcome.      REHAB POTENTIAL: Good   CLINICAL DECISION MAKING: Evolving/moderate complexity   EVALUATION COMPLEXITY: Moderate     GOALS: Goals reviewed with patient? No   SHORT TERM GOALS: Target date: 07/19/2021   Patient will be independent with initial home exercise program for self-management of symptoms. Baseline: Initial HEP to be provided at visit 2 as appropriate (07/05/21); Goal status: In-progress     LONG TERM GOALS: Target date: 09/27/2021   Patient will be independent with a long-term home exercise program for self-management of symptoms.  Baseline: Initial HEP to be provided at visit 2 as appropriate (07/05/21); Goal status: In-progress   2.  Patient will demonstrate improved FOTO to equal or greater than 67 by visit #14 to demonstrate improvement in overall condition and self-reported functional ability.  Baseline: 42 (07/05/21); Goal status: In-progress   3.  Patient will demonstrate L hip and knee MMT equal or greater to R hip and knee MMT to demonstrate improved strength for functional activities such as walking and working.  Baseline: lacking strength on left LE - see objective (07/05/21); Goal status: In-progress   4.  Patient will ambulate equal or greater than 1000 feet during 6 Minute Walk Test with no assistive device and normal gait pattern to demonstrate improved community and household ambulation and decreased fall risk.  Baseline: ambulates with SPC in R LE with antalgic gait favoring L LE and minimizing weight bearing through L LE.  (07/05/21);  843 feet with SPC in R hand, locking left knee for stability, one stumble and patient recovered without falling or physical assist form PT. SBA provided  for safety (07/14/2021);  Goal status: In-progress   5.  Patient will complete community, work and/or recreational activities without limitation due to current condition.  Baseline: difficulty usual physical function and basic mobility such as bed mobility, transfers, community and household ambulation, working, lifting, carrying, bending, jogging, running (07/05/21); Goal status: In-progress   PLAN: PT FREQUENCY: 2x/week   PT DURATION: 12 weeks   PLANNED INTERVENTIONS: Therapeutic exercises, Therapeutic activity, Neuromuscular re-education, Balance training, Gait training, Patient/Family education, Joint mobilization, Stair training, DME instructions, Dry Needling, Electrical stimulation, Spinal mobilization, Cryotherapy, Moist heat, Manual therapy, and Re-evaluation.   PLAN FOR NEXT SESSION: Update HEP as appropriate, progressive LE strengthening and balance as tolerated.    Everlean Alstrom. Graylon Good, PT, DPT 08/03/21, 3:46 PM  [facility] [address] [place], [place] [address] P: [phone] I F: [phone]

## 2021-08-03 ENCOUNTER — Ambulatory Visit: Payer: 59 | Admitting: Physical Therapy

## 2021-08-03 ENCOUNTER — Encounter: Payer: Self-pay | Admitting: Physical Therapy

## 2021-08-03 DIAGNOSIS — M6281 Muscle weakness (generalized): Secondary | ICD-10-CM

## 2021-08-03 DIAGNOSIS — Z9181 History of falling: Secondary | ICD-10-CM

## 2021-08-03 DIAGNOSIS — R262 Difficulty in walking, not elsewhere classified: Secondary | ICD-10-CM

## 2021-08-03 DIAGNOSIS — M79605 Pain in left leg: Secondary | ICD-10-CM

## 2021-08-05 ENCOUNTER — Ambulatory Visit: Payer: 59 | Admitting: Physical Therapy

## 2021-08-05 ENCOUNTER — Ambulatory Visit
Admission: RE | Admit: 2021-08-05 | Discharge: 2021-08-05 | Disposition: A | Discharge status: Home / Self Care | Payer: 59 | Source: Ambulatory Visit | Attending: Physician Assistant | Admitting: Physician Assistant

## 2021-08-05 ENCOUNTER — Encounter: Payer: Self-pay | Admitting: Physical Therapy

## 2021-08-05 DIAGNOSIS — R262 Difficulty in walking, not elsewhere classified: Secondary | ICD-10-CM | POA: Diagnosis present

## 2021-08-05 DIAGNOSIS — R29898 Other symptoms and signs involving the musculoskeletal system: Secondary | ICD-10-CM

## 2021-08-05 DIAGNOSIS — I639 Cerebral infarction, unspecified: Secondary | ICD-10-CM | POA: Insufficient documentation

## 2021-08-05 DIAGNOSIS — M4807 Spinal stenosis, lumbosacral region: Secondary | ICD-10-CM | POA: Diagnosis present

## 2021-08-05 DIAGNOSIS — Z9181 History of falling: Secondary | ICD-10-CM

## 2021-08-05 DIAGNOSIS — M6281 Muscle weakness (generalized): Secondary | ICD-10-CM

## 2021-08-05 DIAGNOSIS — M79605 Pain in left leg: Secondary | ICD-10-CM | POA: Diagnosis present

## 2021-08-05 IMAGING — MR MR LUMBAR SPINE W/O CM
5 series · 31 of 48 positions shown · non-contrast
Comparison: None Available.

CLINICAL DATA: Episode of feeling bad which cause left leg
weakness.

EXAM:
MRI LUMBAR SPINE WITHOUT CONTRAST
TECHNIQUE: Multiplanar, multisequence MR imaging of the lumbar spine was
performed. No intravenous contrast was administered.

[Series 5: T2 · sagittal · 4.0mm · 0.88mm/px · 6 of 17 slices shown (1 of 2)]
[im 1/17]
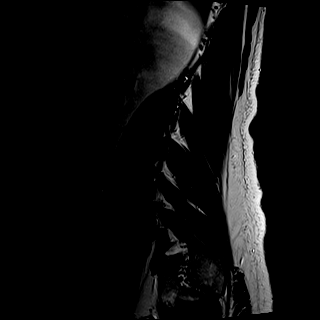
[im 4/17]
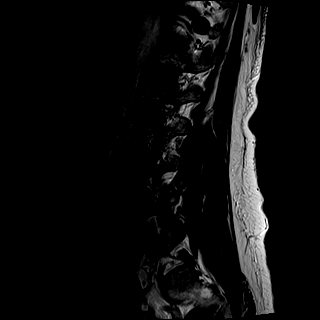
[im 7/17]
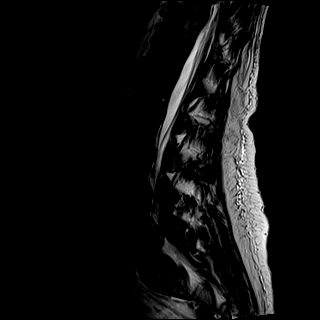
[im 10/17]
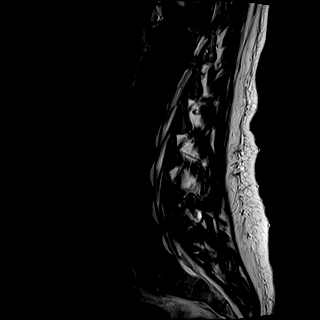
[im 13/17]
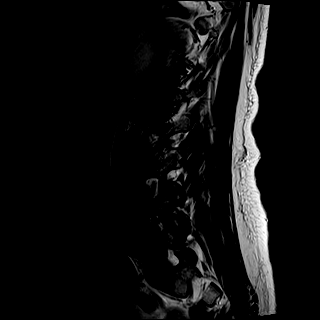
[im 17/17]
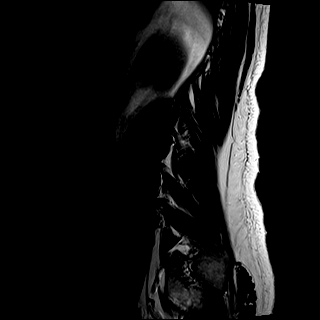

[Series 6: T1 · sagittal · 4.0mm · 0.88mm/px · 7 of 17 slices shown (1 of 2)]
[im 1/17]
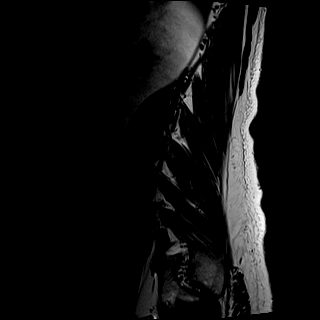
[im 3/17]
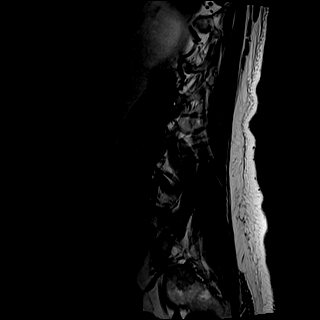
[im 6/17]
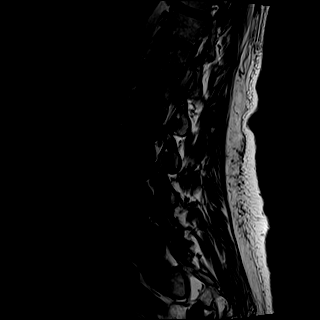
[im 9/17]
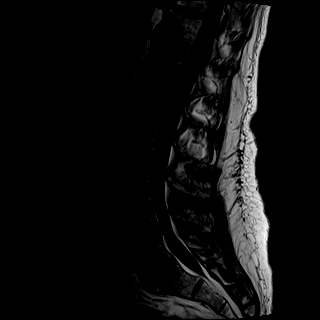
[im 11/17]
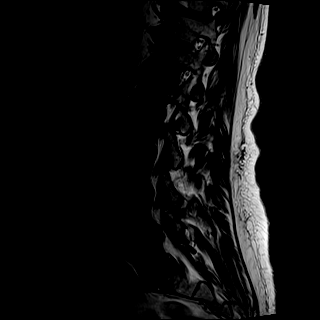
[im 14/17]
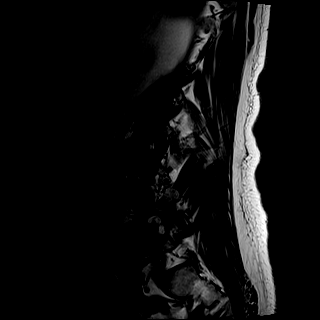
[im 17/17]
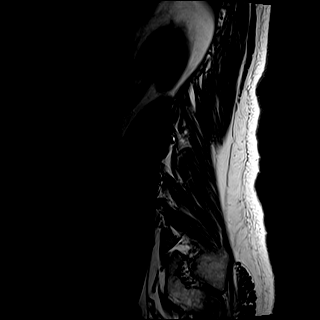

[Series 7: STIR · sagittal · 4.0mm · 0.44mm/px · 2 of 17 slices shown]
[im 1/17]
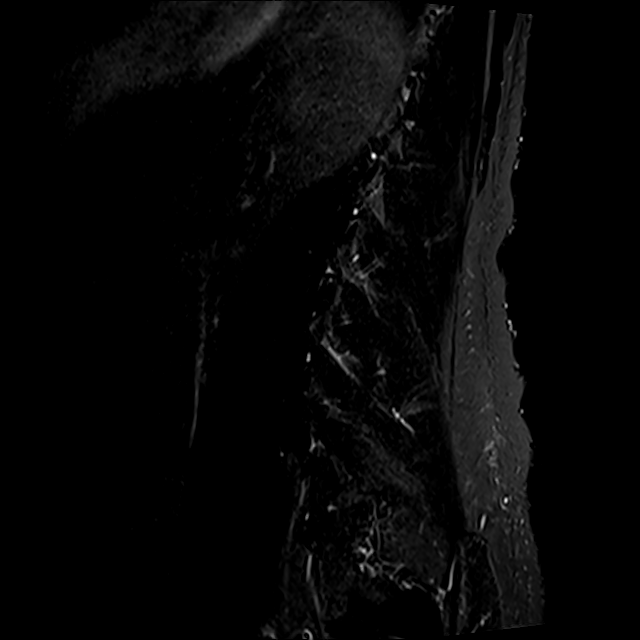
[im 3/17]
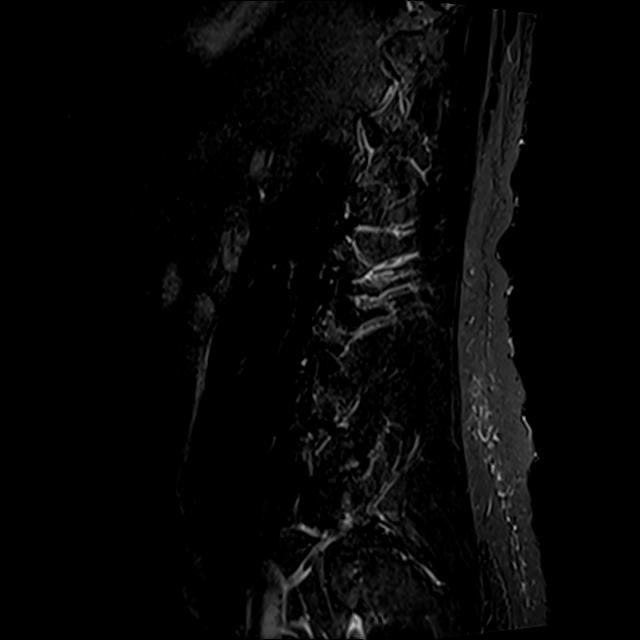

[Series 8: T2 · axial · 4.0mm · 0.78mm/px · z∈[+14,+250]mm · 8 of 35 slices shown (2 of 2)]
[im 1/35]
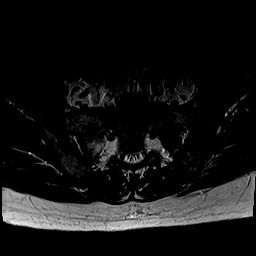
[im 6/35]
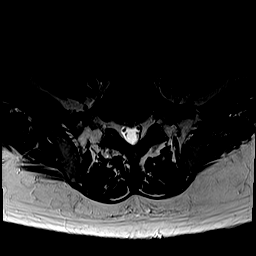
[im 11/35]
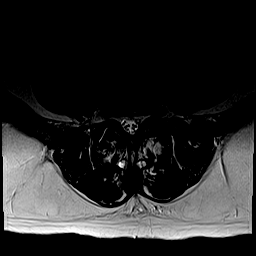
[im 16/35]
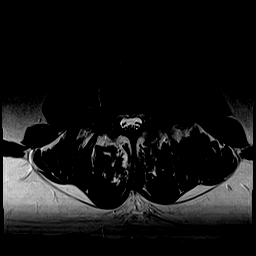
[im 19/35]
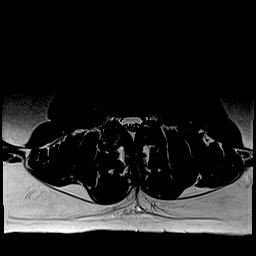
[im 24/35]
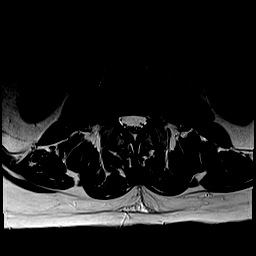
[im 29/35]
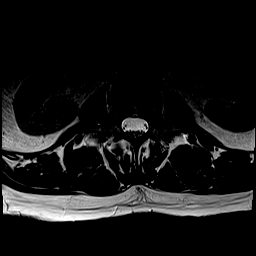
[im 35/35]
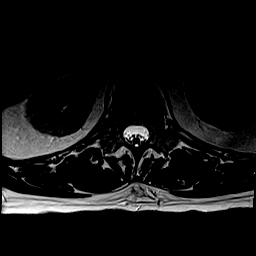

[Series 9: T1 · axial · 4.0mm · 0.39mm/px · z∈[+14,+250]mm · 8 of 35 slices shown (2 of 2)]
[im 1/35]
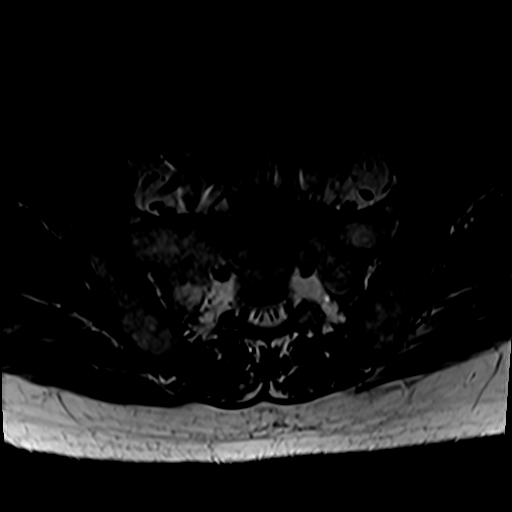
[im 6/35]
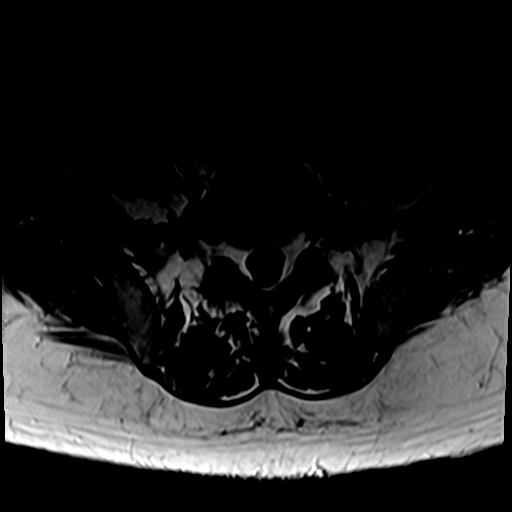
[im 11/35]
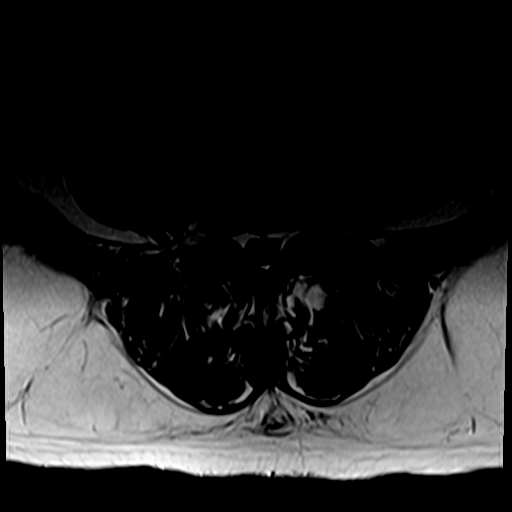
[im 16/35]
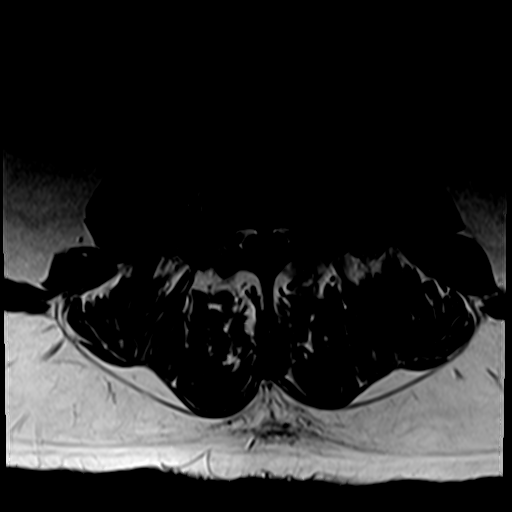
[im 19/35]
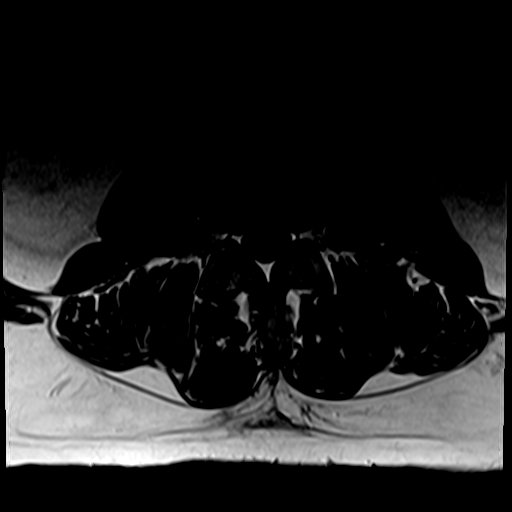
[im 24/35]
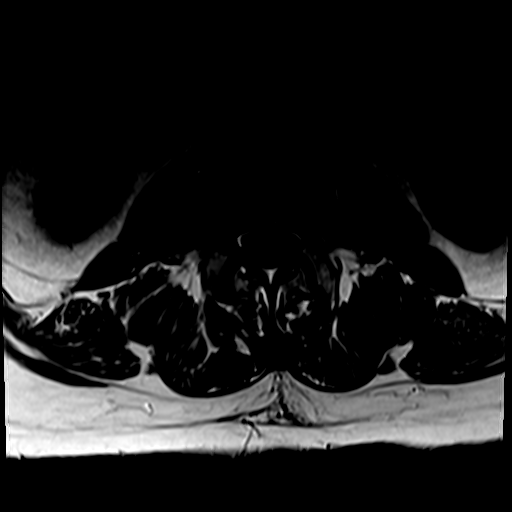
[im 29/35]
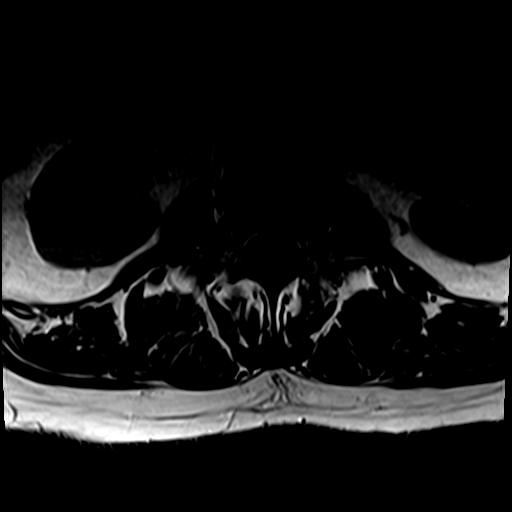
[im 35/35]
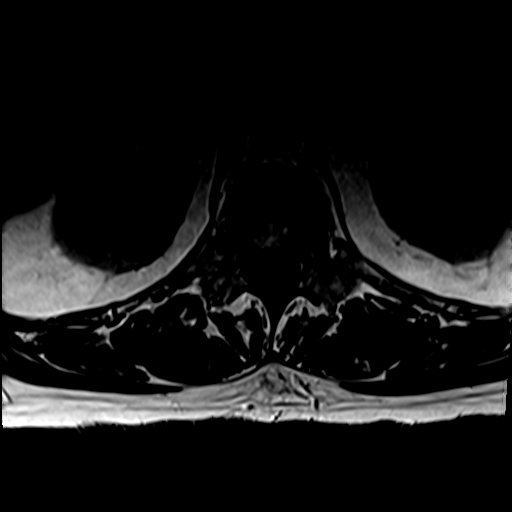

[31 of 48 positions shown; findings below may reference images not displayed]

FINDINGS: Segmentation: Incomplete segmentation between the transverse
processes of L5 and the sacrum

Alignment:  Physiologic.

Vertebrae:  No fracture, evidence of discitis, or bone lesion.

Conus medullaris and cauda equina: Conus extends to the T12-L1
level. Conus and cauda equina appear normal.

Paraspinal and other soft tissues: Negative for perispinal mass or
inflammation

Disc levels:

T12- L1: Unremarkable.

L1-L2: Unremarkable.

L2-L3: Unremarkable.

L3-L4: Disc narrowing and bulging with mild facet spurring. Mild
spinal stenosis

L4-L5: Disc narrowing and foraminal predominant bulging. Moderate
degenerative facet spurring. Mild triangular narrowing the thecal
sac

L5-S1:Incomplete segmentation with no degenerative changes
IMPRESSION: L3-4 and L4-5 degeneration as described. No impingement to explain
left leg symptoms.

## 2021-08-05 IMAGING — MR MR HEAD W/O CM
12 series · 48 of 48 positions shown · non-contrast
Comparison: No prior MRI, correlation is made with CT head
[DATE]

CLINICAL DATA: Malaise, left leg weakness

EXAM:
MRI HEAD WITHOUT CONTRAST
TECHNIQUE: Multiplanar, multiecho pulse sequences of the brain and surrounding
structures were obtained without intravenous contrast.

[Series 5: ax dwi_tracew · axial · 3.0mm · 0.65mm/px · z∈[-61,+106]mm · 4 of 52 slices shown]
[im 1/52]
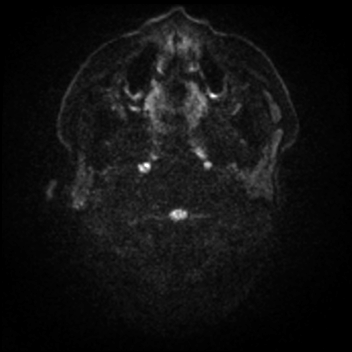
[im 18/52]
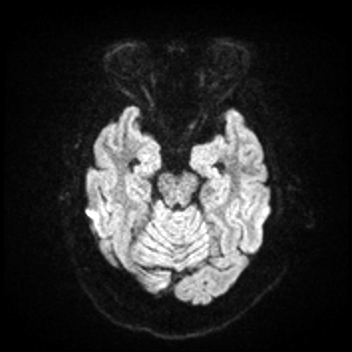
[im 35/52]
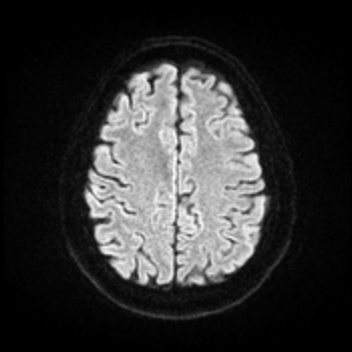
[im 52/52]
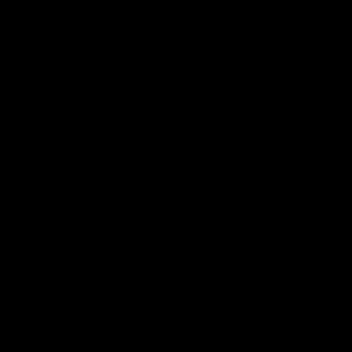

[Series 6: ax dwi_adc · axial · 3.0mm · 0.65mm/px · z∈[-61,+99]mm · 3 of 50 slices shown]
[im 1/50]
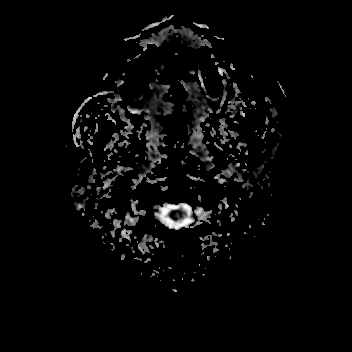
[im 25/50]
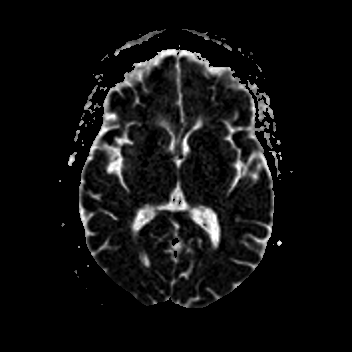
[im 50/50]
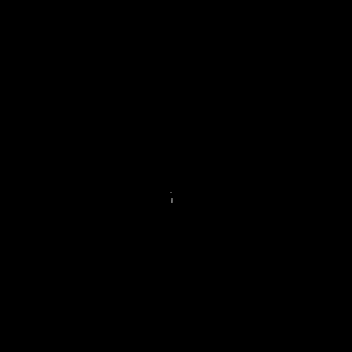

[Series 7: cor dwi_tracew · coronal · 5.0mm · 0.60mm/px · 3 of 42 slices shown]
[im 1/42]
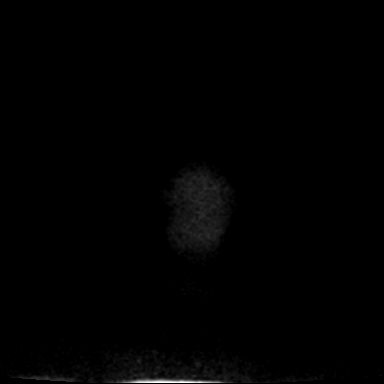
[im 21/42]
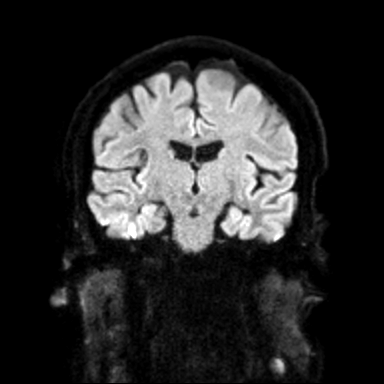
[im 42/42]
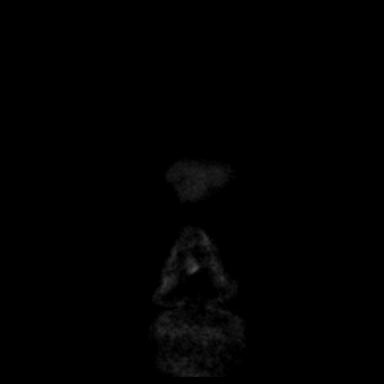

[Series 8: cor dwi_adc · coronal · 5.0mm · 0.60mm/px · 3 of 42 slices shown]
[im 1/42]
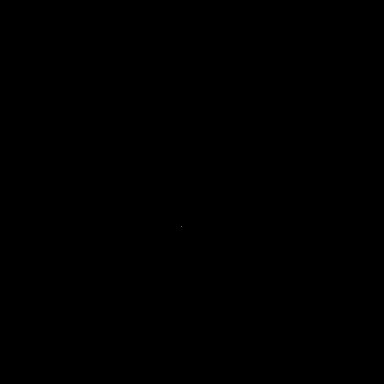
[im 21/42]
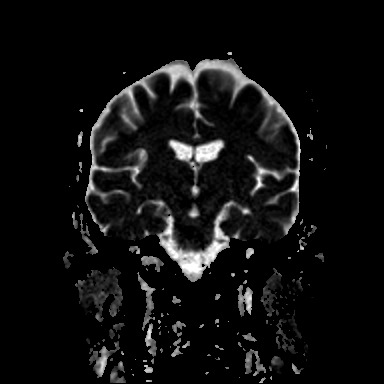
[im 42/42]
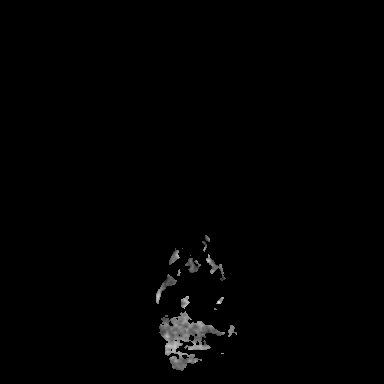

[Series 9: T2 · axial · 5.0mm · 0.53mm/px · z∈[-56,+99]mm · 2 of 27 slices shown (1 of 2)]
[im 1/27]
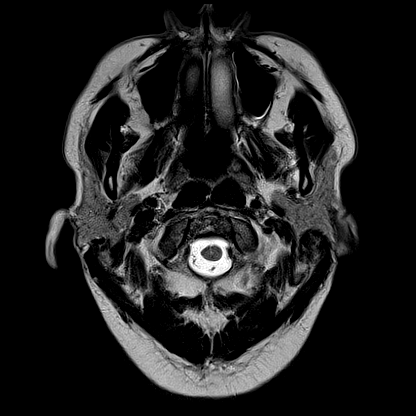
[im 27/27]
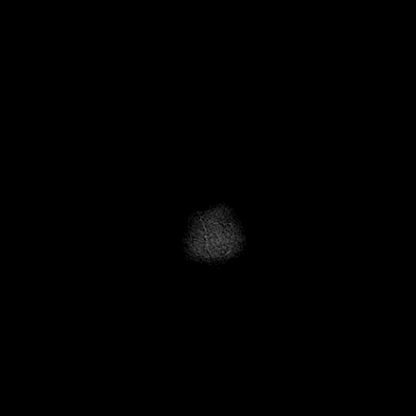

[Series 10: T1 · sagittal · 5.0mm · 0.62mm/px · 1 of 23 slices shown (1 of 2)]
[im 1/23]
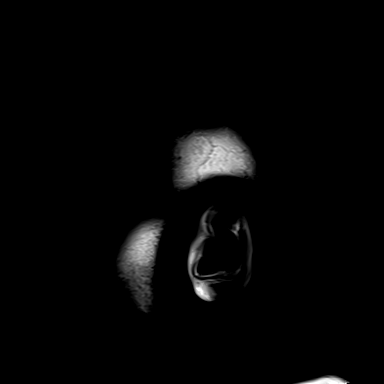

[Series 11: ax swi_mag · axial · 2.0mm · 0.90mm/px · z∈[-56,+101]mm · 5 of 80 slices shown]
[im 1/80]
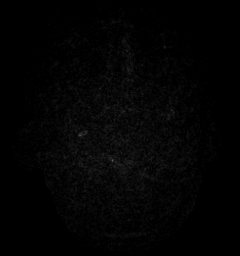
[im 20/80]
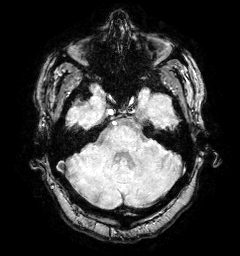
[im 40/80]
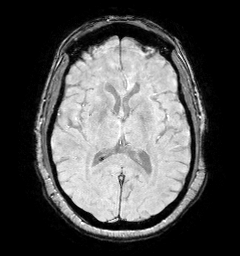
[im 60/80]
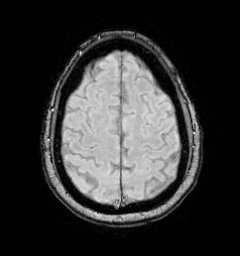
[im 80/80]
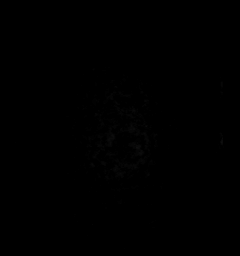

[Series 12: ax swi_pha · axial · 2.0mm · 0.90mm/px · z∈[-56,+101]mm · 5 of 80 slices shown]
[im 1/80]
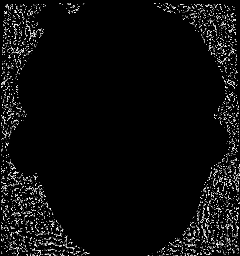
[im 20/80]
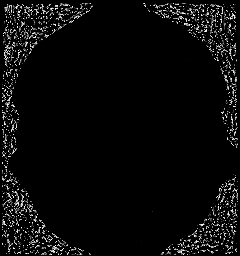
[im 40/80]
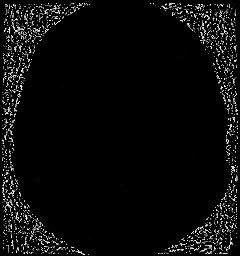
[im 60/80]
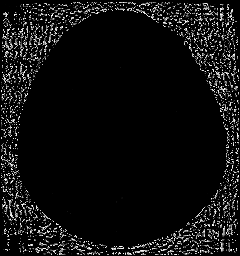
[im 80/80]
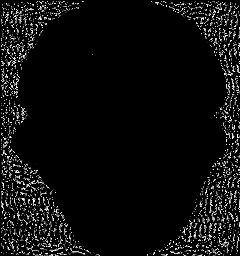

[Series 13: ax swi_swi · axial · 2.0mm · 0.90mm/px · z∈[-56,+101]mm · 5 of 80 slices shown]
[im 1/80]
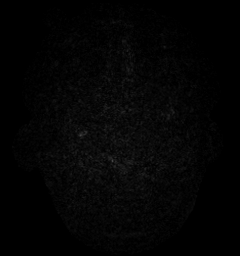
[im 20/80]
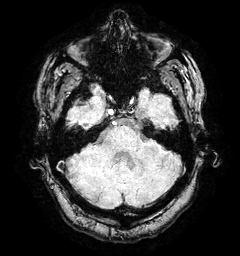
[im 40/80]
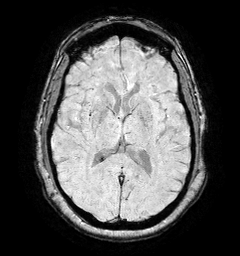
[im 60/80]
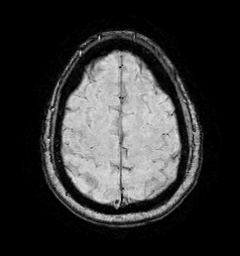
[im 80/80]
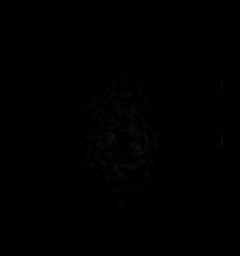

[Series 15: FLAIR · axial · 3.0mm · 0.53mm/px · z∈[-58,+102]mm · 4 of 55 slices shown]
[im 1/55]
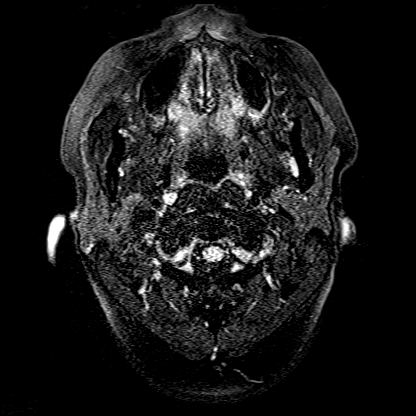
[im 19/55]
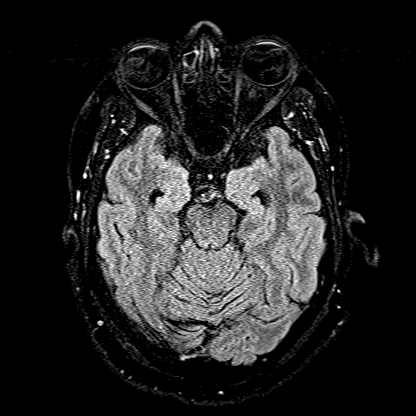
[im 37/55]
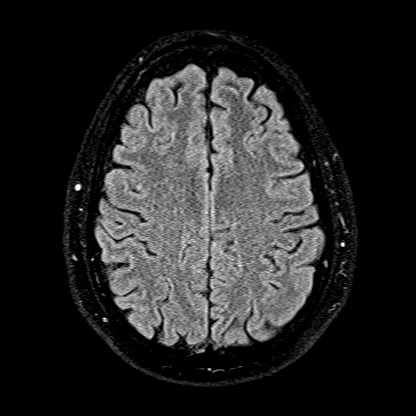
[im 55/55]
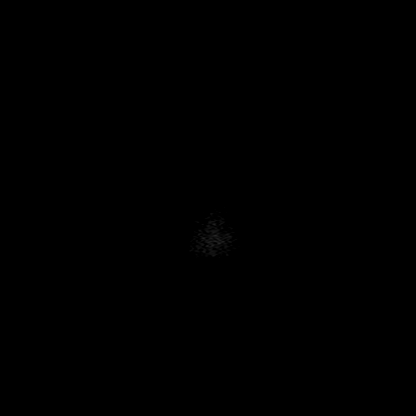

[Series 16: T1 · axial · 1.0mm · 0.98mm/px · z∈[-63,+110]mm · 11 of 175 slices shown (2 of 2)]
[im 1/175]
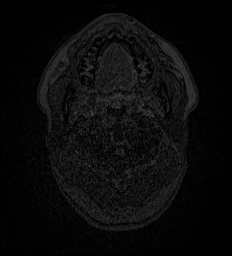
[im 18/175]
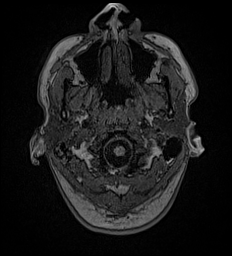
[im 35/175]
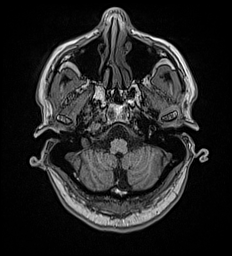
[im 53/175]
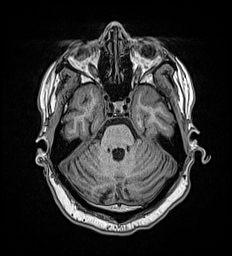
[im 70/175]
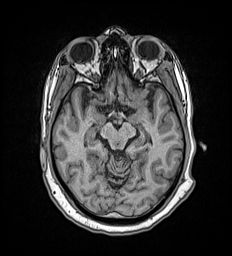
[im 88/175]
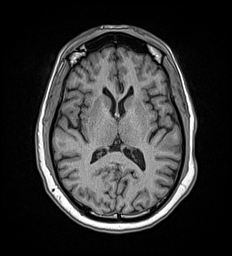
[im 105/175]
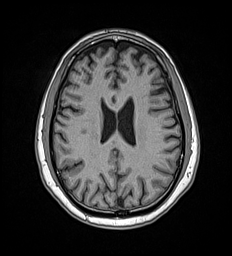
[im 122/175]
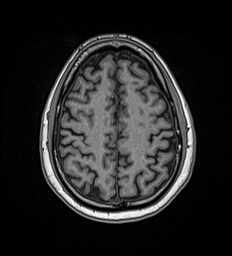
[im 140/175]
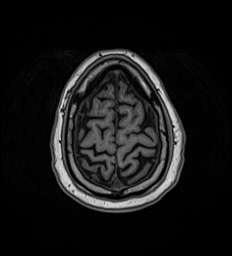
[im 157/175]
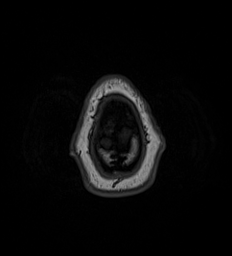
[im 175/175]
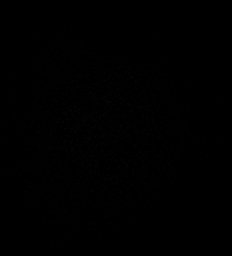

[Series 17: T2 · coronal · 5.0mm · 0.57mm/px · 2 of 31 slices shown (2 of 2)]
[im 1/31]
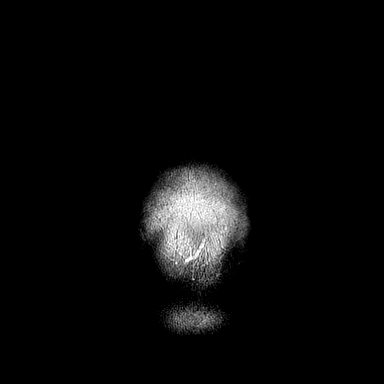
[im 31/31]
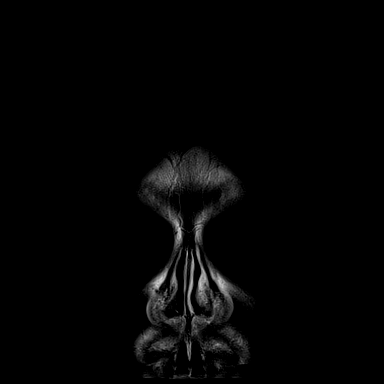

[48 of 48 positions shown; findings below may reference images not displayed]

FINDINGS: Brain: No restricted diffusion to suggest acute or subacute infarct.
No acute hemorrhage, mass, mass effect, or midline shift. No
hydrocephalus or extra-axial collection. Scattered T2 hyperintense
signal in the periventricular white matter, likely the sequela of
minimal chronic small vessel ischemic disease.

Vascular: Normal arterial flow voids.

Skull and upper cervical spine: Normal marrow signal.

Sinuses/Orbits: No acute finding.

Other: The mastoids are well aerated.
IMPRESSION: No acute intracranial process. No evidence of acute or subacute
infarct.

## 2021-08-05 NOTE — Therapy (Signed)
OUTPATIENT PHYSICAL THERAPY TREATMENT NOTE   Patient Name: Shane Dunn MRN: [id number] DOB:[birth year], 56 y.o., male Today's Date: 08/05/21   PCP: [place] REFERRING PROVIDER: Luella Cook, PA-C [facility])  END OF SESSION:   PT End of Session - 08/05/21 1358     Visit Number 7    Number of Visits 24    Date for PT Re-Evaluation 12/20/21    Authorization Type [place] reporting from 07/05/2021    Authorization Time Period VL 30 PT/OT/Chiro    Authorization - Visit Number 7    Authorization - Number of Visits 30    Progress Note Due on Visit 10    PT Start Time [id number]    PT Stop Time 1432    PT Time Calculation (min) 38 min    Activity Tolerance Patient tolerated treatment well;No increased pain    Behavior During Therapy [facility] for tasks assessed/performed                  History reviewed. No pertinent past medical history. History reviewed. No pertinent surgical history. There are no problems to display for this patient.   REFERRING DIAG: leg weakness, difficulty walking  THERAPY DIAG:  Muscle weakness (generalized)  Pain in left leg  Difficulty in walking, not elsewhere classified  History of falling  Rationale for Evaluation and Treatment: Rehabilitation  ONSET DATE: 04/25/2021  PERTINENT HISTORY: Patient is a 56 y.o. male who presents to outpatient physical therapy with a referral for medical diagnosis leg weakness, difficulty walking. This patient's chief complaints consist of sudden onset left lower leg pain, possibly paresthesia, and L LE weakness resulting in falls and leading to the following functional deficits: difficulty with usual physical function and basic mobility such as bed mobility, transfers, community and household ambulation, working, lifting, carrying, bending, jogging, running. 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  Patient denies hx of cancer, stroke, seizures, lung problems, heart problems, unexplained weight loss, unexplained changes in bowel or bladder problems, unexplained stumbling or dropping things, and spinal surgery  PRECAUTIONS:  Other: not allowed to eat sugar, salt, and sweet drinks.  SUBJECTIVE: Patient arrives with his SPC. He states he took his scooter to the clinic today and he started riding again on Saturday. He had his lumbar and brain MRI this morning but has not gotten any results yet. He states he will get the results when he follows up with his neurologist on September 07, 2021. He states he is having no pain currently and had no pain or soreness following last PT session.   PAIN:  Are you having pain? no   OBJECTIVE  TODAY'S TREATMENT  Therapeutic exercise: to centralize symptoms and improve ROM, strength, muscular endurance, and activity tolerance required for successful completion of functional activities.  - NuStep level 6 using bilateral lower extremities. Seat/handle setting 15. For improved extremity mobility, muscular endurance, and activity tolerance; and to induce the analgesic effect of aerobic exercise, stimulate improved joint nutrition, and prepare body structures and systems for following interventions. x 5  minutes. Average SPM = 106. - squats on TOTAL GYM: 1x10 B LE at level 26, 3x10 L LE at level 10 (PT guarding to prevent hyperextension, patient assisting slightly with hands).  - supine eft short arc quad with AAROM, 1x10 - supine left very short arc quad with AAROM using Y/RTB to assist, patient able to complete partial SLR with  flexed knee, 3x10.  - seated LAQ L LE with AAROM using RTB loop to assist, 3x10 (incomplete ROM and patient kicks to try to get increased ROM).  - reverse "deadmill" 1x2 min.   Pt required multimodal cuing for proper technique and to facilitate improved neuromuscular control, strength, range of motion, and functional ability  resulting in improved performance and form. Guarding and cuing needed for safety.   PATIENT EDUCATION:  Education details: Exercise purpose/form. Self management techniques. Person educated: Patient and mother Education method: Explanation, demonstration, verbal, visual, and tactile cuing Education comprehension: verbalized understanding, returned demonstration, and needs further education     HOME EXERCISE PROGRAM: Access Code: [email or web address] URL: [email or web address] Date: 07/26/2021 Prepared by: Norton Blizzard  Exercises - Sit to Stand Without Arm Support  - 1 x daily - 3 sets - 10 reps - Seated Long Arc Quad  - 1 x daily - 3 sets - 10 reps   ASSESSMENT:   CLINICAL IMPRESSION: Patient tolerated treatment well overall with no increase in pain by end of session. He continues to have minimal strength in left knee extension but gives good effort throughout session. Some muscle activity in left quads are visible and palpable with effort but patient continues to have 1/5 MMT for this muscle group. Plan to continue efforts to strengthening left quad at next session. Patient would benefit from continued management of limiting condition by skilled physical therapist to address remaining impairments and functional limitations to work towards stated goals and return to PLOF or maximal functional independence.   Patient is a 56 y.o. male referred to outpatient physical therapy with a medical diagnosis of leg weakness, difficulty walking who presents with signs and symptoms consistent with left LE weakness, unsteady on feet, abnormal gait. Patient's weakness is most profound in left knee extension followed by L hip flexion. He also has weakness of other hip motions that are not as prominent. Unable to identify lumbar source of pain and testing for upper motor neuron injury was negative. Unclear why patient experienced sudden weakness or why it has not been restored at this point. Patient does  have difficulty with communication and has cognitive differences related to his mother's information that he is likely on the autism spectrum, which hinders communication about symptoms during testing. Patient presents with significant paresthesia?, AROM, gait, balance, posture, muscle performance (strength/power/endurance) and activity tolerance impairments that are limiting ability to complete his usual physical function and basic mobility such as bed mobility, transfers, community and household ambulation, working, lifting, carrying, bending, jogging, running without difficulty. Patient will benefit from skilled physical therapy intervention to address current body structure impairments and activity limitations to improve function and work towards goals set in current POC in order to return to prior level of function or maximal functional improvement.    OBJECTIVE IMPAIRMENTS Abnormal gait, decreased activity tolerance, decreased balance, decreased coordination, decreased endurance, decreased knowledge of condition, decreased mobility, difficulty walking, decreased ROM, decreased strength, impaired perceived functional ability, impaired sensation, impaired tone, and improper body mechanics.    ACTIVITY LIMITATIONS community activity, occupation, and   usual physical function and basic mobility such as bed mobility, transfers, community and household ambulation, working, lifting, carrying, bending, jogging, running.    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      REHAB POTENTIAL: Good   CLINICAL DECISION MAKING:  Evolving/moderate complexity   EVALUATION COMPLEXITY: Moderate     GOALS: Goals reviewed with patient? No   SHORT TERM GOALS: Target date:  07/19/2021   Patient will be independent with initial home exercise program for self-management of symptoms. Baseline: Initial HEP to be provided at visit 2 as appropriate (07/05/21); Goal status: In-progress     LONG TERM GOALS: Target date: 09/27/2021   Patient will be independent with a long-term home exercise program for self-management of symptoms.  Baseline: Initial HEP to be provided at visit 2 as appropriate (07/05/21); Goal status: In-progress   2.  Patient will demonstrate improved FOTO to equal or greater than 67 by visit #14 to demonstrate improvement in overall condition and self-reported functional ability.  Baseline: 42 (07/05/21); Goal status: In-progress   3.  Patient will demonstrate L hip and knee MMT equal or greater to R hip and knee MMT to demonstrate improved strength for functional activities such as walking and working.  Baseline: lacking strength on left LE - see objective (07/05/21); Goal status: In-progress   4.  Patient will ambulate equal or greater than 1000 feet during 6 Minute Walk Test with no assistive device and normal gait pattern to demonstrate improved community and household ambulation and decreased fall risk.  Baseline: ambulates with SPC in R LE with antalgic gait favoring L LE and minimizing weight bearing through L LE.  (07/05/21);  843 feet with SPC in R hand, locking left knee for stability, one stumble and patient recovered without falling or physical assist form PT. SBA provided for safety (07/14/2021);  Goal status: In-progress   5.  Patient will complete community, work and/or recreational activities without limitation due to current condition.  Baseline: difficulty usual physical function and basic mobility such as bed mobility, transfers, community and household ambulation, working, lifting, carrying, bending, jogging, running (07/05/21); Goal status: In-progress   PLAN: PT FREQUENCY: 2x/week   PT DURATION: 12 weeks   PLANNED  INTERVENTIONS: Therapeutic exercises, Therapeutic activity, Neuromuscular re-education, Balance training, Gait training, Patient/Family education, Joint mobilization, Stair training, DME instructions, Dry Needling, Electrical stimulation, Spinal mobilization, Cryotherapy, Moist heat, Manual therapy, and Re-evaluation.   PLAN FOR NEXT SESSION: Update HEP as appropriate, progressive LE strengthening and balance as tolerated.    Luretha Murphy. Ilsa Iha, PT, DPT 08/05/21, 7:25 PM  [facility] [address] [place], [place] [address] P: [phone] I F: [phone]

## 2021-08-09 ENCOUNTER — Encounter: Payer: Self-pay | Admitting: Physical Therapy

## 2021-08-09 ENCOUNTER — Ambulatory Visit: Payer: 59 | Admitting: Physical Therapy

## 2021-08-09 DIAGNOSIS — M79605 Pain in left leg: Secondary | ICD-10-CM

## 2021-08-09 DIAGNOSIS — R262 Difficulty in walking, not elsewhere classified: Secondary | ICD-10-CM

## 2021-08-09 DIAGNOSIS — Z9181 History of falling: Secondary | ICD-10-CM

## 2021-08-09 DIAGNOSIS — M6281 Muscle weakness (generalized): Secondary | ICD-10-CM | POA: Diagnosis not present

## 2021-08-09 NOTE — Therapy (Signed)
OUTPATIENT PHYSICAL THERAPY TREATMENT NOTE   Patient Name: Shane Dunn MRN: [id number] DOB:[birth year], 56 y.o., male Today's Date: 08/09/21   PCP: [place] REFERRING PROVIDER: Luella Cook, PA-C [facility])  END OF SESSION:   PT End of Session - 08/09/21 1506     Visit Number 8    Number of Visits 24    Date for PT Re-Evaluation 12/20/21    Authorization Type [place] reporting from 07/05/2021    Authorization Time Period VL 30 PT/OT/Chiro    Authorization - Visit Number 8    Authorization - Number of Visits 30    Progress Note Due on Visit 10    PT Start Time [id number]    PT Stop Time 1517    PT Time Calculation (min) 38 min    Activity Tolerance Patient tolerated treatment well;No increased pain    Behavior During Therapy [facility] for tasks assessed/performed                   History reviewed. No pertinent past medical history. History reviewed. No pertinent surgical history. There are no problems to display for this patient.   REFERRING DIAG: leg weakness, difficulty walking  THERAPY DIAG:  Muscle weakness (generalized)  Pain in left leg  Difficulty in walking, not elsewhere classified  History of falling  Rationale for Evaluation and Treatment: Rehabilitation  ONSET DATE: 04/25/2021  PERTINENT HISTORY: Patient is a 56 y.o. male who presents to outpatient physical therapy with a referral for medical diagnosis leg weakness, difficulty walking. This patient's chief complaints consist of sudden onset left lower leg pain, possibly paresthesia, and L LE weakness resulting in falls and leading to the following functional deficits: difficulty with usual physical function and basic mobility such as bed mobility, transfers, community and household ambulation, working, lifting, carrying, bending, jogging, running. Relevant past medical history and comorbidities include HTN, hyperlipidemia, diabetes, CKD stage 3, likely  on autism spectrum.  Patient denies hx of cancer, stroke, seizures, lung problems, heart problems, unexplained weight loss, unexplained changes in bowel or bladder problems, unexplained stumbling or dropping things, and spinal surgery  PRECAUTIONS:  Other: not allowed to eat sugar, salt, and sweet drinks.  SUBJECTIVE: Patient reports he has no pain upon arrival. He comes with his SPC. Brain and Lumbar MRI is negative for anything that explains L LE weakness per the reports found in the chart. He felt okay after last PT session. Patient states he looked up ACL on his phone and references it in regards to his right knee. Denies having an ACL injury in the past. He states he rode his scooter to PT today.   PAIN:  Are you having pain? no   OBJECTIVE  TODAY'S TREATMENT  Therapeutic exercise: to centralize symptoms and improve ROM, strength, muscular endurance, and activity tolerance required for successful completion of functional activities.  - reverse "deadmill" 3x2 min. - seated forwards/backwards walking stool scoots while sitting on rolling stool, 3x20 feet each direction.  - squats on TOTAL GYM: 3x10 L LE at level 10 (PT guarding to prevent hyperextension, patient assisting slightly with hands), 3x10 R LE.  - seated LAQ L LE with AAROM using RTB loop to assist, 3x20 (incomplete ROM and patient kicks to try to get increased ROM).  - standing  R toe taps up and down to step with L knee slightly bent with support from PT and B UE, 2x20.   Pt required multimodal cuing for proper technique and  to facilitate improved neuromuscular control, strength, range of motion, and functional ability resulting in improved performance and form. Guarding and cuing needed for safety.   PATIENT EDUCATION:  Education details: Exercise purpose/form. Self management techniques. Person educated: Patient and mother Education method: Explanation, demonstration, verbal, visual, and tactile cuing Education  comprehension: verbalized understanding, returned demonstration, and needs further education     HOME EXERCISE PROGRAM: Access Code: [email or web address] URL: [email or web address] Date: 07/26/2021 Prepared by: Norton Blizzard  Exercises - Sit to Stand Without Arm Support  - 1 x daily - 3 sets - 10 reps - Seated Long Arc Quad  - 1 x daily - 3 sets - 10 reps   ASSESSMENT:   CLINICAL IMPRESSION: Patient tolerated treatment well with no increase in pain by end of session. He continues to be unable to extend L knee against gravity but gives good effort during exercises and had visible quad contraction with exercise. Plan to continue working on improving L quad strength next session. Imaging did not find a reason for sudden quad weakness per report. Patient's sudden loss of strength and function is unusual and at this point unexplained. Progress in improvement appears to be very slow and small at this point. Patient would benefit from continued management of limiting condition by skilled physical therapist to address remaining impairments and functional limitations to work towards stated goals and return to PLOF or maximal functional independence.   Patient is a 56 y.o. male referred to outpatient physical therapy with a medical diagnosis of leg weakness, difficulty walking who presents with signs and symptoms consistent with left LE weakness, unsteady on feet, abnormal gait. Patient's weakness is most profound in left knee extension followed by L hip flexion. He also has weakness of other hip motions that are not as prominent. Unable to identify lumbar source of pain and testing for upper motor neuron injury was negative. Unclear why patient experienced sudden weakness or why it has not been restored at this point. Patient does have difficulty with communication and has cognitive differences related to his mother's information that he is likely on the autism spectrum, which hinders communication about  symptoms during testing. Patient presents with significant paresthesia?, AROM, gait, balance, posture, muscle performance (strength/power/endurance) and activity tolerance impairments that are limiting ability to complete his usual physical function and basic mobility such as bed mobility, transfers, community and household ambulation, working, lifting, carrying, bending, jogging, running without difficulty. Patient will benefit from skilled physical therapy intervention to address current body structure impairments and activity limitations to improve function and work towards goals set in current POC in order to return to prior level of function or maximal functional improvement.    OBJECTIVE IMPAIRMENTS Abnormal gait, decreased activity tolerance, decreased balance, decreased coordination, decreased endurance, decreased knowledge of condition, decreased mobility, difficulty walking, decreased ROM, decreased strength, impaired perceived functional ability, impaired sensation, impaired tone, and improper body mechanics.    ACTIVITY LIMITATIONS community activity, occupation, and   usual physical function and basic mobility such as bed mobility, transfers, community and household ambulation, working, lifting, carrying, bending, jogging, running.    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      REHAB POTENTIAL: Good   CLINICAL DECISION MAKING: Evolving/moderate complexity   EVALUATION COMPLEXITY: Moderate     GOALS: Goals reviewed with patient? No   SHORT TERM  GOALS: Target date: 07/19/2021   Patient will be independent with initial home exercise program for self-management of symptoms. Baseline: Initial HEP to be provided at visit 2 as appropriate  (07/05/21); Goal status: In-progress     LONG TERM GOALS: Target date: 09/27/2021   Patient will be independent with a long-term home exercise program for self-management of symptoms.  Baseline: Initial HEP to be provided at visit 2 as appropriate (07/05/21); Goal status: In-progress   2.  Patient will demonstrate improved FOTO to equal or greater than 67 by visit #14 to demonstrate improvement in overall condition and self-reported functional ability.  Baseline: 42 (07/05/21); Goal status: In-progress   3.  Patient will demonstrate L hip and knee MMT equal or greater to R hip and knee MMT to demonstrate improved strength for functional activities such as walking and working.  Baseline: lacking strength on left LE - see objective (07/05/21); Goal status: In-progress   4.  Patient will ambulate equal or greater than 1000 feet during 6 Minute Walk Test with no assistive device and normal gait pattern to demonstrate improved community and household ambulation and decreased fall risk.  Baseline: ambulates with SPC in R LE with antalgic gait favoring L LE and minimizing weight bearing through L LE.  (07/05/21);  843 feet with SPC in R hand, locking left knee for stability, one stumble and patient recovered without falling or physical assist form PT. SBA provided for safety (07/14/2021);  Goal status: In-progress   5.  Patient will complete community, work and/or recreational activities without limitation due to current condition.  Baseline: difficulty usual physical function and basic mobility such as bed mobility, transfers, community and household ambulation, working, lifting, carrying, bending, jogging, running (07/05/21); Goal status: In-progress   PLAN: PT FREQUENCY: 2x/week   PT DURATION: 12 weeks   PLANNED INTERVENTIONS: Therapeutic exercises, Therapeutic activity, Neuromuscular re-education, Balance training, Gait training, Patient/Family education, Joint mobilization, Stair training,  DME instructions, Dry Needling, Electrical stimulation, Spinal mobilization, Cryotherapy, Moist heat, Manual therapy, and Re-evaluation.   PLAN FOR NEXT SESSION: Update HEP as appropriate, progressive LE strengthening and balance as tolerated.    Luretha Murphy. Ilsa Iha, PT, DPT 08/09/21, 4:28 PM  [facility] [address] [place], [place] [address] P: [phone] I F: [phone]

## 2021-08-11 ENCOUNTER — Ambulatory Visit: Payer: 59 | Admitting: Physical Therapy

## 2021-08-11 ENCOUNTER — Encounter: Payer: Self-pay | Admitting: Physical Therapy

## 2021-08-11 DIAGNOSIS — Z9181 History of falling: Secondary | ICD-10-CM

## 2021-08-11 DIAGNOSIS — M6281 Muscle weakness (generalized): Secondary | ICD-10-CM

## 2021-08-11 DIAGNOSIS — R262 Difficulty in walking, not elsewhere classified: Secondary | ICD-10-CM

## 2021-08-11 DIAGNOSIS — M79605 Pain in left leg: Secondary | ICD-10-CM

## 2021-08-11 NOTE — Therapy (Signed)
OUTPATIENT PHYSICAL THERAPY TREATMENT / PROGRESS NOTE Dates of reporting from 07/05/2021 to 08/11/2021   Patient Name: Shane Dunn MRN: [id number] DOB:10-14-1965, 56 y.o., male Today's Date: 08/11/21   PCP: Phineas Real [facility] REFERRING PROVIDER: Nilda Calamity, PA-C [facility] Neurology)  END OF SESSION:   PT End of Session - 08/11/21 1350     Visit Number 9    Number of Visits 24    Date for PT Re-Evaluation 10/03/21    Authorization Type [employer] reporting from 07/05/2021    Authorization Time Period VL 30 PT/OT/Chiro    Authorization - Visit Number 9    Authorization - Number of Visits 30    Progress Note Due on Visit 10    PT Start Time 1350    PT Stop Time 1428    PT Time Calculation (min) 38 min    Activity Tolerance Patient tolerated treatment well;No increased pain    Behavior During Therapy [facility] for tasks assessed/performed                   History reviewed. No pertinent past medical history. History reviewed. No pertinent surgical history. There are no problems to display for this patient.   REFERRING DIAG: leg weakness, difficulty walking  THERAPY DIAG:  Muscle weakness (generalized)  Pain in left leg  Difficulty in walking, not elsewhere classified  History of falling  Rationale for Evaluation and Treatment: Rehabilitation  ONSET DATE: 04/25/2021  PERTINENT HISTORY: Patient is a 56 y.o. male who presents to outpatient physical therapy with a referral for medical diagnosis leg weakness, difficulty walking. This patient's chief complaints consist of sudden onset left lower leg pain, possibly paresthesia, and L LE weakness resulting in falls and leading to the following functional deficits: difficulty with usual physical function and basic mobility such as bed mobility, transfers, community and household ambulation, working, lifting, carrying, bending, jogging, running. Relevant past medical history and comorbidities  include HTN, hyperlipidemia, diabetes, CKD stage 3, likely on autism spectrum.  Patient denies hx of cancer, stroke, seizures, lung problems, heart problems, unexplained weight loss, unexplained changes in bowel or bladder problems, unexplained stumbling or dropping things, and spinal surgery  PRECAUTIONS:  Other: not allowed to eat sugar, salt, and sweet drinks.  SUBJECTIVE: Patient reports he is feeling well with no pain inside his left leg. He states he has a little soreness over his left kneecap but states there is no number for that soreness. He states his L LE has improved good since starting PT. He states it has improved "by staying in the same situation."  He has been doing his HEP regularly.   PAIN:  Are you having pain? no   OBJECTIVE  SELF-REPORTED FUNCTION FOTO score: 63/100 (upper leg questionnaire, assisted by PT since mother Artelia Laroche was not present to assist or be proxy as at initial eval)  MUSCLE PERFORMANCE (MMT):  *Indicates pain 07/05/21 08/11/21 Date  Joint/Motion R/L R/L R/L  Hip        Flexion (L1, L2) 4/3 5/3+ /  Extension (knee ext) 4+/4- 4+/4+ /  Abduction 5/4 5/5 /  Knee        Extension (L3) 5/2 5/2 /  Flexion (S2) 5/5 5/5 /  Ankle/Foot        Dorsiflexion (L4) 5/4+ 5/4+ /  Great toe extension (L5) 5/5 5/5 /  Eversion (S1) 5/5 5/5 /  Plantarflexion (S1) 4+/4+ 4/4 /  Comments:  08/11/2021: L knee AROM extension (from seated at  100 flexion): ~ -70 degrees.   FUNCTIONAL/BALANCE TESTS 6 Minute Walk Test: 765 feet with SPC in R hand, locking left knee for stability, steppage gait during swing throughout left LE part of the time. Gait quality degrading over time. Supervision.   TODAY'S TREATMENT  Therapeutic exercise: to centralize symptoms and improve ROM, strength, muscular endurance, and activity tolerance required for successful completion of functional activities.  - testing to assess progress (See above) - reverse "deadmill" 1x2 min.  Pt required  multimodal cuing for proper technique and to facilitate improved neuromuscular control, strength, range of motion, and functional ability resulting in improved performance and form. Guarding and cuing needed for safety.   PATIENT EDUCATION:  Education details: Exercise purpose/form. Self management techniques. POC, progress. Person educated: Patient and mother Education method: Explanation, demonstration, verbal, visual cuing.  Education comprehension: verbalized understanding, returned demonstration, and needs further education     HOME EXERCISE PROGRAM: Access Code: [email or web address] URL: [email or web address] Date: 07/26/2021 Prepared by: Norton Blizzard  Exercises - Sit to Stand Without Arm Support  - 1 x daily - 3 sets - 10 reps - Seated Long Arc Quad  - 1 x daily - 3 sets - 10 reps   ASSESSMENT:   CLINICAL IMPRESSION: Patient has attended 9 physical therapy sessions since starting current episode of care on 07/05/2021. Patient has made progress towards HEP goals, FOTO (self reported function) goal, and reports he feels he has improved (although he has a hard time pinpointing how). He also reports resolution of pain since starting PT. He shows mild improvements in hip and ankle strength that could be attributed to variability in testing or patient's ability to understand and follow instructions during MMT. Patient appears to have no improvement in L knee extension strength or 6 Minute Walk test. Patient's main impairment is severely weak L quad of 2/5 in MMT that requires him to use compensatory strategies such as locking his L knee into hyperextension during stance phase of gait that puts abnormal pressure on the posterior knee capsule that may lead to damage there. Brain and Lumbar MRI are negative for findings that would explain his symptoms per reports in chart and as of know it does not appear to be known why patient is experiencing L quad weakness, while it is not readily improving.  Recommend patient follow up with referring provider as soon as able for further medical evaluation. Plan to continue PT focused on improving L quad strength and activation in hopes of recovery of function. Patient would benefit from continued management of limiting condition by skilled physical therapist to address remaining impairments and functional limitations to work towards stated goals and return to PLOF or maximal functional independence.    Assessment from PT eval 07/05/2021: Patient is a 56 y.o. male referred to outpatient physical therapy with a medical diagnosis of leg weakness, difficulty walking who presents with signs and symptoms consistent with left LE weakness, unsteady on feet, abnormal gait. Patient's weakness is most profound in left knee extension followed by L hip flexion. He also has weakness of other hip motions that are not as prominent. Unable to identify lumbar source of pain and testing for upper motor neuron injury was negative. Unclear why patient experienced sudden weakness or why it has not been restored at this point. Patient does have difficulty with communication and has cognitive differences related to his mother's information that he is likely on the autism spectrum, which hinders communication about symptoms during testing. Patient presents with significant paresthesia?,  AROM, gait, balance, posture, muscle performance (strength/power/endurance) and activity tolerance impairments that are limiting ability to complete his usual physical function and basic mobility such as bed mobility, transfers, community and household ambulation, working, lifting, carrying, bending, jogging, running without difficulty. Patient will benefit from skilled physical therapy intervention to address current body structure impairments and activity limitations to improve function and work towards goals set in current POC in order to return to prior level of function or maximal functional improvement.     OBJECTIVE IMPAIRMENTS Abnormal gait, decreased activity tolerance, decreased balance, decreased coordination, decreased endurance, decreased knowledge of condition, decreased mobility, difficulty walking, decreased ROM, decreased strength, impaired perceived functional ability, impaired sensation, impaired tone, and improper body mechanics.    ACTIVITY LIMITATIONS community activity, occupation, and   usual physical function and basic mobility such as bed mobility, transfers, community and household ambulation, working, lifting, carrying, bending, jogging, running.    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      REHAB POTENTIAL: Good   CLINICAL DECISION MAKING: Evolving/moderate complexity   EVALUATION COMPLEXITY: Moderate     GOALS: Goals reviewed with patient? No   SHORT TERM GOALS: Target date: 07/19/2021   Patient will be independent with initial home exercise program for self-management of symptoms. Baseline: Initial HEP to be provided at visit 2 as appropriate (07/05/21); Goal status: In-progress     LONG TERM GOALS: Target date: 09/27/2021   Patient will be independent with a long-term home exercise program for self-management of symptoms.  Baseline: Initial HEP to be provided at visit 2 as appropriate (07/05/21); Goal status: In-progress   2.  Patient will demonstrate improved FOTO to equal or greater than 67 by visit #14 to demonstrate improvement in overall condition and self-reported functional ability.  Baseline: 42 (07/05/21); 63 (08/11/2021);  Goal status: In-progress   3.  Patient will demonstrate L hip and knee MMT equal or greater to R hip and knee MMT to demonstrate improved strength for functional activities such as walking  and working.  Baseline: lacking strength on left LE - see objective (07/05/21); Goal status: In-progress   4.  Patient will ambulate equal or greater than 1000 feet during 6 Minute Walk Test with no assistive device and normal gait pattern to demonstrate improved community and household ambulation and decreased fall risk.  Baseline: ambulates with SPC in R LE with antalgic gait favoring L LE and minimizing weight bearing through L LE.  (07/05/21);  843 feet with SPC in R hand, locking left knee for stability, one stumble and patient recovered without falling or physical assist form PT. SBA provided for safety (07/14/2021);  Goal status: In-progress   5.  Patient will complete community, work and/or recreational activities without limitation due to current condition.  Baseline: difficulty usual physical function and basic mobility such as bed mobility, transfers, community and household ambulation, working, lifting, carrying, bending, jogging, running (07/05/21); Goal status: In-progress   PLAN: PT FREQUENCY: 2x/week   PT DURATION: 12 weeks   PLANNED INTERVENTIONS: Therapeutic exercises, Therapeutic activity, Neuromuscular re-education, Balance training, Gait training, Patient/Family education, Joint mobilization, Stair training, DME instructions, Dry Needling, Electrical stimulation, Spinal mobilization, Cryotherapy, Moist heat, Manual therapy, and Re-evaluation.   PLAN FOR NEXT SESSION: Update HEP as appropriate, progressive LE strengthening and balance as tolerated.    Everlean Alstrom. Graylon Good, PT, DPT  08/11/21, [place] [address] [place], [place] [address] P: [phone] I F: [phone]

## 2021-08-17 ENCOUNTER — Ambulatory Visit: Payer: 59

## 2021-08-17 ENCOUNTER — Encounter: Payer: Self-pay | Admitting: Physical Therapy

## 2021-08-17 DIAGNOSIS — R262 Difficulty in walking, not elsewhere classified: Secondary | ICD-10-CM

## 2021-08-17 DIAGNOSIS — M6281 Muscle weakness (generalized): Secondary | ICD-10-CM | POA: Diagnosis not present

## 2021-08-17 DIAGNOSIS — M79605 Pain in left leg: Secondary | ICD-10-CM

## 2021-08-17 DIAGNOSIS — Z9181 History of falling: Secondary | ICD-10-CM

## 2021-08-19 ENCOUNTER — Ambulatory Visit: Payer: 59 | Admitting: Physical Therapy

## 2021-08-19 ENCOUNTER — Encounter: Payer: Self-pay | Admitting: Physical Therapy

## 2021-08-19 DIAGNOSIS — M6281 Muscle weakness (generalized): Secondary | ICD-10-CM

## 2021-08-19 DIAGNOSIS — R262 Difficulty in walking, not elsewhere classified: Secondary | ICD-10-CM

## 2021-08-19 DIAGNOSIS — M79605 Pain in left leg: Secondary | ICD-10-CM

## 2021-08-19 DIAGNOSIS — Z9181 History of falling: Secondary | ICD-10-CM

## 2021-08-19 NOTE — Therapy (Signed)
OUTPATIENT PHYSICAL THERAPY TREATMENT   Patient Name: Shane Dunn MRN: [id number] DOB:[birth year], 56 y.o., male Today's Date: 08/19/21   PCP: Phineas Real [facility] REFERRING PROVIDER: Nilda Calamity, PA-C [facility] Neurology)  END OF SESSION:   PT End of Session - 08/19/21 1443     Visit Number 11    Number of Visits 24    Date for PT Re-Evaluation 10/03/21    Authorization Type [employer] reporting from 08/17/2021    Authorization Time Period VL 30 PT/OT/Chiro    Authorization - Visit Number 11    Authorization - Number of Visits 30    Progress Note Due on Visit 20    PT Start Time 1437    PT Stop Time 1515    PT Time Calculation (min) 38 min    Activity Tolerance Patient tolerated treatment well;No increased pain    Behavior During Therapy [facility] for tasks assessed/performed                    History reviewed. No pertinent past medical history. History reviewed. No pertinent surgical history. There are no problems to display for this patient.   REFERRING DIAG: leg weakness, difficulty walking  THERAPY DIAG:  Muscle weakness (generalized)  Pain in left leg  Difficulty in walking, not elsewhere classified  History of falling  Rationale for Evaluation and Treatment: Rehabilitation  ONSET DATE: 04/25/2021  PERTINENT HISTORY: Patient is a 56 y.o. male who presents to outpatient physical therapy with a referral for medical diagnosis leg weakness, difficulty walking. This patient's chief complaints consist of sudden onset left lower leg pain, possibly paresthesia, and L LE weakness resulting in falls and leading to the following functional deficits: difficulty with usual physical function and basic mobility such as bed mobility, transfers, community and household ambulation, working, lifting, carrying, bending, jogging, running. 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  Patient denies hx of cancer, stroke, seizures, lung problems, heart problems, unexplained weight loss, unexplained changes in bowel or bladder problems, unexplained stumbling or dropping things, and spinal surgery  PRECAUTIONS:  Other: not allowed to eat sugar, salt, and sweet drinks.  SUBJECTIVE: Patient reports he is feeling well. States he has some pain at his left anterior knee only. Chart review shows neurology recommended EMG testing to patient's mother but she declined at this time due to wanting to complete PT first.   PAIN:  Are you having pain? no   OBJECTIVE  TODAY'S TREATMENT   Therapeutic exercise: to centralize symptoms and improve ROM, strength, muscular endurance, and activity tolerance required for successful completion of functional activities.  - NuStep level 4-6 using bilateral lower extremities. Seat setting 14. For improved extremity mobility, muscular endurance, and activity tolerance; and to induce the analgesic effect of aerobic exercise, stimulate improved joint nutrition, and prepare body structures and systems for following interventions. x 6  minutes. Average SPM = 104. - seated forwards/backwards walking stool scoots while sitting on rolling stool, 3x20-30 feet each direction.  - reverse "deadmill" 3x2 min. (Seated break between). - squats on TOTAL GYM: 3x10  SLS each side at level 10. Patient uses L hand to assist initial drive with left side.   Pt required multimodal cuing for proper technique and to facilitate improved neuromuscular control, strength, range of motion, and functional ability resulting in improved performance and form. Guarding and cuing needed for safety.   PATIENT EDUCATION:  Education details:  form/technique with exercise. Person educated: Patient and mother Education method: Explanation, demonstration, verbal, visual cuing.  Education comprehension: verbalized understanding, returned demonstration, and needs further  education     HOME EXERCISE PROGRAM: Access Code: [email or web address] URL: [email or web address] Date: 07/26/2021 Prepared by: Norton Blizzard  Exercises - Sit to Stand Without Arm Support  - 1 x daily - 3 sets - 10 reps - Seated Long Arc Quad  - 1 x daily - 3 sets - 10 reps   ASSESSMENT:   CLINICAL IMPRESSION: Patient tolerated treatment well with no increase in pain with sufficient rest breaks. He continues to demonstrate visible active contraction of L quad muscles without the ability to extend knee against gravity. Session continued to focus on exercises to improve left quad strength. Patient educated that PT also recommends EMG to help better understand why his quads are weak. Patient would benefit from continued management of limiting condition by skilled physical therapist to address remaining impairments and functional limitations to work towards stated goals and return to PLOF or maximal functional independence.    Assessment from PT eval 07/05/2021: Patient is a 56 y.o. male referred to outpatient physical therapy with a medical diagnosis of leg weakness, difficulty walking who presents with signs and symptoms consistent with left LE weakness, unsteady on feet, abnormal gait. Patient's weakness is most profound in left knee extension followed by L hip flexion. He also has weakness of other hip motions that are not as prominent. Unable to identify lumbar source of pain and testing for upper motor neuron injury was negative. Unclear why patient experienced sudden weakness or why it has not been restored at this point. Patient does have difficulty with communication and has cognitive differences related to his mother's information that he is likely on the autism spectrum, which hinders communication about symptoms during testing. Patient presents with significant paresthesia?, AROM, gait, balance, posture, muscle performance (strength/power/endurance) and activity tolerance impairments that  are limiting ability to complete his usual physical function and basic mobility such as bed mobility, transfers, community and household ambulation, working, lifting, carrying, bending, jogging, running without difficulty. Patient will benefit from skilled physical therapy intervention to address current body structure impairments and activity limitations to improve function and work towards goals set in current POC in order to return to prior level of function or maximal functional improvement.    OBJECTIVE IMPAIRMENTS Abnormal gait, decreased activity tolerance, decreased balance, decreased coordination, decreased endurance, decreased knowledge of condition, decreased mobility, difficulty walking, decreased ROM, decreased strength, impaired perceived functional ability, impaired sensation, impaired tone, and improper body mechanics.    ACTIVITY LIMITATIONS community activity, occupation, and   usual physical function and basic mobility such as bed mobility, transfers, community and household ambulation, working, lifting, carrying, bending, jogging, running.    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      REHAB POTENTIAL: Good   CLINICAL DECISION MAKING: Evolving/moderate complexity   EVALUATION COMPLEXITY: Moderate     GOALS: Goals reviewed with patient? No   SHORT TERM GOALS: Target date: 07/19/2021   Patient will be independent with initial home exercise program for self-management of symptoms. Baseline: Initial HEP to be provided at visit 2 as appropriate (07/05/21); Goal status: In-progress     LONG TERM GOALS: Target date: 09/27/2021   Patient will be independent with a long-term home exercise program  for self-management of symptoms.  Baseline:  Initial HEP to be provided at visit 2 as appropriate (07/05/21); Goal status: In-progress   2.  Patient will demonstrate improved FOTO to equal or greater than 67 by visit #14 to demonstrate improvement in overall condition and self-reported functional ability.  Baseline: 42 (07/05/21); 63 (08/11/2021);  Goal status: In-progress   3.  Patient will demonstrate L hip and knee MMT equal or greater to R hip and knee MMT to demonstrate improved strength for functional activities such as walking and working.  Baseline: lacking strength on left LE - see objective (07/05/21); Goal status: In-progress   4.  Patient will ambulate equal or greater than 1000 feet during 6 Minute Walk Test with no assistive device and normal gait pattern to demonstrate improved community and household ambulation and decreased fall risk.  Baseline: ambulates with SPC in R LE with antalgic gait favoring L LE and minimizing weight bearing through L LE.  (07/05/21);  843 feet with SPC in R hand, locking left knee for stability, one stumble and patient recovered without falling or physical assist form PT. SBA provided for safety (07/14/2021);  Goal status: In-progress   5.  Patient will complete community, work and/or recreational activities without limitation due to current condition.  Baseline: difficulty usual physical function and basic mobility such as bed mobility, transfers, community and household ambulation, working, lifting, carrying, bending, jogging, running (07/05/21); Goal status: In-progress   PLAN: PT FREQUENCY: 2x/week   PT DURATION: 12 weeks   PLANNED INTERVENTIONS: Therapeutic exercises, Therapeutic activity, Neuromuscular re-education, Balance training, Gait training, Patient/Family education, Joint mobilization, Stair training, DME instructions, Dry Needling, Electrical stimulation, Spinal mobilization, Cryotherapy, Moist heat, Manual therapy, and Re-evaluation.   PLAN FOR NEXT SESSION: Update HEP as  appropriate, progressive LE strengthening and balance as tolerated.    Everlean Alstrom. Graylon Good, PT, DPT 08/19/21, 3:53 PM  [facility] [address] [place], [place] [address] P: [phone] I F: [phone]

## 2021-08-23 ENCOUNTER — Encounter: Payer: Self-pay | Admitting: Physical Therapy

## 2021-08-23 ENCOUNTER — Ambulatory Visit: Payer: 59 | Attending: Physician Assistant | Admitting: Physical Therapy

## 2021-08-23 DIAGNOSIS — M79605 Pain in left leg: Secondary | ICD-10-CM | POA: Insufficient documentation

## 2021-08-23 DIAGNOSIS — M6281 Muscle weakness (generalized): Secondary | ICD-10-CM | POA: Diagnosis present

## 2021-08-23 DIAGNOSIS — R262 Difficulty in walking, not elsewhere classified: Secondary | ICD-10-CM | POA: Insufficient documentation

## 2021-08-23 DIAGNOSIS — Z9181 History of falling: Secondary | ICD-10-CM | POA: Diagnosis present

## 2021-08-23 NOTE — Therapy (Signed)
OUTPATIENT PHYSICAL THERAPY TREATMENT   Patient Name: Shane Dunn MRN: [id number] DOB:07-Oct-1965, 56 y.o., male Today's Date: 08/23/21   PCP: [place] REFERRING PROVIDER: Luella Cook, PA-C [facility] Neurology)  END OF SESSION:   PT End of Session - 08/23/21 1916     Visit Number 12    Number of Visits 24    Date for PT Re-Evaluation 10/03/21    Authorization Type [place] reporting from 08/17/2021    Authorization Time Period VL 30 PT/OT/Chiro    Authorization - Visit Number 12    Authorization - Number of Visits 30    Progress Note Due on Visit 20    PT Start Time 1910    PT Stop Time 1948    PT Time Calculation (min) 38 min    Activity Tolerance Patient tolerated treatment well;No increased pain    Behavior During Therapy [facility] for tasks assessed/performed              History reviewed. No pertinent past medical history. History reviewed. No pertinent surgical history. There are no problems to display for this patient.   REFERRING DIAG: leg weakness, difficulty walking  THERAPY DIAG:  Muscle weakness (generalized)  Pain in left leg  Difficulty in walking, not elsewhere classified  History of falling  Rationale for Evaluation and Treatment: Rehabilitation  ONSET DATE: 04/25/2021  PERTINENT HISTORY: Patient is a 56 y.o. male who presents to outpatient physical therapy with a referral for medical diagnosis leg weakness, difficulty walking. This patient's chief complaints consist of sudden onset left lower leg pain, possibly paresthesia, and L LE weakness resulting in falls and leading to the following functional deficits: difficulty with usual physical function and basic mobility such as bed mobility, transfers, community and household ambulation, working, lifting, carrying, bending, jogging, running. 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  Patient denies hx of cancer, stroke, seizures, lung problems, heart problems, unexplained weight loss, unexplained changes in bowel or bladder problems, unexplained stumbling or dropping things, and spinal surgery  PRECAUTIONS:  Other: not allowed to eat sugar, salt, and sweet drinks.  SUBJECTIVE: Patient reports no pain upon arrival but also no improvement in his strength. He continues to use a SPC and lock his left knee for stability while ambulating.   PAIN:  Are you having pain? no   OBJECTIVE   MUSCLE PERFORMANCE  MMT L knee extension: 1/5 (unable to perform full ROM in gravity minimized position).   TODAY'S TREATMENT   Therapeutic exercise: to centralize symptoms and improve ROM, strength, muscular endurance, and activity tolerance required for successful completion of functional activities.  - reverse "deadmill" 3x2 min. Attempted just L LE, but unable due to weakness.  - supine L LE wall slides with left heel on wall sliding up and down wall. 2x10, 2x20. Unable to extend knee if heel got lower than knee and needed AAROM assist to start heel some reps. Manual assistance to minimize compensations (twisting leg, kicking hard) - supine gravity reduced short arc quad with leg on wall. Unable to extend left knee actively due to weakness but had PROM and able to complete easily on R side.  - sidelying gravity minimized L knee extension, 3x10 with manual assistance to minimize compensations (IR of hip so gravity assisted knee extension). Patient unable to fully extend knee and unable to complete exercise without mild compensation of L hip IR so gravity assisted L  knee extension.  - supine L knee very short arc quad/quad set. Patient unable.  - education with patient and patient's mother Shane Dunn (by phone) about lack of progress with strengthening and recommendation from PT that they consider recommendations by neurologist including further testing with EMG/Nerve conduction test.     Pt required multimodal cuing for proper technique and to facilitate improved neuromuscular control, strength, range of motion, and functional ability resulting in improved performance and form. Guarding and cuing needed for safety.   PATIENT EDUCATION:  Education details: form/technique with exercise. Person educated: Patient and mother Education method: Explanation, demonstration, verbal, visual cuing.  Education comprehension: verbalized understanding, returned demonstration, and needs further education     HOME EXERCISE PROGRAM: Access Code: [email or web address] URL: [email or web address] Date: 07/26/2021 Prepared by: Norton Blizzard  Exercises - Sit to Stand Without Arm Support  - 1 x daily - 3 sets - 10 reps - Seated Long Arc Quad  - 1 x daily - 3 sets - 10 reps   ASSESSMENT:   CLINICAL IMPRESSION: Patient tolerated treatment well with no increase in pain with sufficient rest breaks. Although his original pain has improved, patient shows no improvement in left quad strength, which is concerning since even a small improvement is expected at this point. Weakness is consistent with  neurologic weakness and lumbar and brain MRIs were negative for findings that would explain this weakness. Therefore, recommended to patient and his mother that they follow up with neurology for further medical assessment/testing and that PT agrees with neurologist's recommendation of EMG/nerve conduction testing. Shane Dunn was unaware of MRI results and said she had only been called to set up EMG testing without speaking to anyone about MRI results or reason for EMG testing. PT had assumed Shane Dunn had been informed of MRI results due messages in chart about setting up EMG following negative results. PT encouraged Shane Dunn to discuss her concerns about EMG tests and questions about patient's condition and treatment outside of PT with neurology. She is planning to attempt to see neurologist more quickly. Patient has reported  some pain in his left patellar region, that is likely developing due to lack of quad strength causing abnormal forces in the knee. Plan to continue working with patient to improve L quad strength until he is able to get more guidance from neurology at his appointment on 7/18 and discontinue PT at that point if not improvement has occurred or if there is no new information that would improve potential for improvement with further PT.  Patient would benefit from continued management of limiting condition by skilled physical therapist to address remaining impairments and functional limitations to work towards stated goals and return to PLOF or maximal functional independence.     Assessment from PT eval 07/05/2021: Patient is a 56 y.o. male referred to outpatient physical therapy with a medical diagnosis of leg weakness, difficulty walking who presents with signs and symptoms consistent with left LE weakness, unsteady on feet, abnormal gait. Patient's weakness is most profound in left knee extension followed by L hip flexion. He also has weakness of other hip motions that are not as prominent. Unable to identify lumbar source of pain and testing for upper motor neuron injury was negative. Unclear why patient experienced sudden weakness or why it has not been restored at this point. Patient does have difficulty with communication and has cognitive differences related to his mother's information that he is likely on the autism spectrum, which hinders communication about symptoms during testing. Patient presents  with significant paresthesia?, AROM, gait, balance, posture, muscle performance (strength/power/endurance) and activity tolerance impairments that are limiting ability to complete his usual physical function and basic mobility such as bed mobility, transfers, community and household ambulation, working, lifting, carrying, bending, jogging, running without difficulty. Patient will benefit from skilled physical  therapy intervention to address current body structure impairments and activity limitations to improve function and work towards goals set in current POC in order to return to prior level of function or maximal functional improvement.    OBJECTIVE IMPAIRMENTS Abnormal gait, decreased activity tolerance, decreased balance, decreased coordination, decreased endurance, decreased knowledge of condition, decreased mobility, difficulty walking, decreased ROM, decreased strength, impaired perceived functional ability, impaired sensation, impaired tone, and improper body mechanics.    ACTIVITY LIMITATIONS community activity, occupation, and   usual physical function and basic mobility such as bed mobility, transfers, community and household ambulation, working, lifting, carrying, bending, jogging, running.    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      REHAB POTENTIAL: Good   CLINICAL DECISION MAKING: Evolving/moderate complexity   EVALUATION COMPLEXITY: Moderate     GOALS: Goals reviewed with patient? No   SHORT TERM GOALS: Target date: 07/19/2021   Patient will be independent with initial home exercise program for self-management of symptoms. Baseline: Initial HEP to be provided at visit 2 as appropriate (07/05/21); Goal status: In-progress     LONG TERM GOALS: Target date: 09/27/2021   Patient will be independent with a long-term home exercise program for self-management of symptoms.  Baseline: Initial HEP to be provided at visit 2 as appropriate (07/05/21); Goal status: In-progress   2.  Patient will demonstrate improved FOTO to equal or greater than 67 by visit #14 to demonstrate improvement in overall condition and self-reported functional ability.   Baseline: 42 (07/05/21); 63 (08/11/2021);  Goal status: In-progress   3.  Patient will demonstrate L hip and knee MMT equal or greater to R hip and knee MMT to demonstrate improved strength for functional activities such as walking and working.  Baseline: lacking strength on left LE - see objective (07/05/21); L knee extension strength 1/5 (08/23/2021:  Goal status: On-going   4.  Patient will ambulate equal or greater than 1000 feet during 6 Minute Walk Test with no assistive device and normal gait pattern to demonstrate improved community and household ambulation and decreased fall risk.  Baseline: ambulates with SPC in R LE with antalgic gait favoring L LE and minimizing weight bearing through L LE.  (07/05/21);  843 feet with SPC in R hand, locking left knee for stability, one stumble and patient recovered without falling or physical assist form PT. SBA provided for safety (07/14/2021);  Goal status: In-progress   5.  Patient will complete community, work and/or recreational activities without limitation due to current condition.  Baseline: difficulty usual physical function and basic mobility such as bed mobility, transfers, community and household ambulation, working, lifting, carrying, bending, jogging, running (07/05/21); Goal status: In-progress   PLAN: PT FREQUENCY: 2x/week   PT DURATION: 12 weeks   PLANNED INTERVENTIONS: Therapeutic exercises, Therapeutic activity, Neuromuscular re-education, Balance training, Gait training, Patient/Family education, Joint mobilization, Stair training, DME instructions, Dry Needling, Electrical stimulation, Spinal mobilization, Cryotherapy, Moist heat, Manual therapy, and Re-evaluation.   PLAN FOR NEXT SESSION: Update HEP as appropriate, progressive LE strengthening and balance  as tolerated.    Luretha Murphy. Ilsa Iha, PT, DPT 08/23/21, 8:31 PM  [facility] [address] [place], [place] [address] P: [phone] I F:  [phone]

## 2021-08-26 ENCOUNTER — Ambulatory Visit: Payer: 59 | Admitting: Physical Therapy

## 2021-08-26 ENCOUNTER — Encounter: Payer: Self-pay | Admitting: Physical Therapy

## 2021-08-26 DIAGNOSIS — M79605 Pain in left leg: Secondary | ICD-10-CM

## 2021-08-26 DIAGNOSIS — Z9181 History of falling: Secondary | ICD-10-CM

## 2021-08-26 DIAGNOSIS — M6281 Muscle weakness (generalized): Secondary | ICD-10-CM | POA: Diagnosis not present

## 2021-08-26 DIAGNOSIS — R262 Difficulty in walking, not elsewhere classified: Secondary | ICD-10-CM

## 2021-08-26 NOTE — Therapy (Signed)
OUTPATIENT PHYSICAL THERAPY TREATMENT   Patient Name: Shane Dunn MRN: [id number] DOB:1966-02-19, 56 y.o., male Today's Date: 08/26/21   PCP: [place] REFERRING PROVIDER: Luella Cook, PA-C [facility] Neurology)  END OF SESSION:   PT End of Session - 08/26/21 1307     Visit Number 13    Number of Visits 24    Date for PT Re-Evaluation 10/03/21    Authorization Type [place] reporting from 08/17/2021    Authorization Time Period VL 30 PT/OT/Chiro    Authorization - Visit Number 13    Authorization - Number of Visits 30    Progress Note Due on Visit 20    PT Start Time 1304    PT Stop Time 1342    PT Time Calculation (min) 38 min    Activity Tolerance Patient tolerated treatment well;No increased pain    Behavior During Therapy [facility] for tasks assessed/performed               History reviewed. No pertinent past medical history. History reviewed. No pertinent surgical history. There are no problems to display for this patient.   REFERRING DIAG: leg weakness, difficulty walking  THERAPY DIAG:  Muscle weakness (generalized)  Pain in left leg  Difficulty in walking, not elsewhere classified  History of falling  Rationale for Evaluation and Treatment: Rehabilitation  ONSET DATE: 04/25/2021  PERTINENT HISTORY: Patient is a 56 y.o. male who presents to outpatient physical therapy with a referral for medical diagnosis leg weakness, difficulty walking. This patient's chief complaints consist of sudden onset left lower leg pain, possibly paresthesia, and L LE weakness resulting in falls and leading to the following functional deficits: difficulty with usual physical function and basic mobility such as bed mobility, transfers, community and household ambulation, working, lifting, carrying, bending, jogging, running. 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  Patient denies hx of cancer, stroke, seizures, lung problems, heart problems, unexplained weight loss, unexplained changes in bowel or bladder problems, unexplained stumbling or dropping things, and spinal surgery  PRECAUTIONS:  Other: not allowed to eat sugar, salt, and sweet drinks.  SUBJECTIVE: Patient reports he is feeling well today with no pain upon arrival. He states his mother called the neurologist and got an earlier follow up appointment on 09/01/2021. Patient arrives using SPC and locking L knee for stability in stance phase. He states he thinks his left LE is getting better because he can see his muscles in his quads when trying to use them.   PAIN:  Are you having pain? no   OBJECTIVE   MUSCLE PERFORMANCE  MMT L knee extension: 1/5 (unable to perform full ROM in gravity minimized position).   TODAY'S TREATMENT   Therapeutic exercise: to centralize symptoms and improve ROM, strength, muscular endurance, and activity tolerance required for successful completion of functional activities.   CIRCUIT: - reverse "deadmill" 3x2 min.  - seated forwards/backwards walking stool scoots while sitting on rolling stool, 3x30 feet each direction attempting to use L LE as much as possible (unable to push backwards effectively with just L LE).   - TOTAL GYM single leg press at level 10, 3x10 each side, manual assistance at start of press on L LE with slow eccentric phase to encourage max motor unit activation.   Pt required multimodal cuing for proper technique and to facilitate improved neuromuscular control, strength, range of motion, and functional ability resulting in improved  performance and form. Guarding and cuing needed for safety.   PATIENT EDUCATION:  Education details: form/technique with exercise. Person educated: Patient and mother Education method: Explanation, demonstration, verbal, visual cuing.  Education comprehension: verbalized understanding, returned  demonstration, and needs further education     HOME EXERCISE PROGRAM: Access Code: [email or web address] URL: [email or web address] Date: 07/26/2021 Prepared by: Rosita Kea  Exercises - Sit to Stand Without Arm Support  - 1 x daily - 3 sets - 10 reps - Seated Long Arc Quad  - 1 x daily - 3 sets - 10 reps   ASSESSMENT:   CLINICAL IMPRESSION: Patient tolerated treatment well with no increase in pain by end of session. Patient very motivated to participate and gives great effort. Continues to have visible contraction of L quads but unable to extend knee against gravity or even in gravity minimized position. Patient's left knee buckled at one point when ambulating but he was able to prevent fall by catching himself with his hand on nearby treadmill. Patient locks L knee in stance phase for stability, which puts pressure on posterior knee capsule. He may benefit from swedish knee cage at some point to help protect knee joint from hyperextension. Patient to see neurologist on 7/12 and plan to continue PT at least until that time.  Patient would benefit from continued management of limiting condition by skilled physical therapist to address remaining impairments and functional limitations to work towards stated goals and return to PLOF or maximal functional independence.    Assessment from PT eval 07/05/2021: Patient is a 56 y.o. male referred to outpatient physical therapy with a medical diagnosis of leg weakness, difficulty walking who presents with signs and symptoms consistent with left LE weakness, unsteady on feet, abnormal gait. Patient's weakness is most profound in left knee extension followed by L hip flexion. He also has weakness of other hip motions that are not as prominent. Unable to identify lumbar source of pain and testing for upper motor neuron injury was negative. Unclear why patient experienced sudden weakness or why it has not been restored at this point. Patient does have  difficulty with communication and has cognitive differences related to his mother's information that he is likely on the autism spectrum, which hinders communication about symptoms during testing. Patient presents with significant paresthesia?, AROM, gait, balance, posture, muscle performance (strength/power/endurance) and activity tolerance impairments that are limiting ability to complete his usual physical function and basic mobility such as bed mobility, transfers, community and household ambulation, working, lifting, carrying, bending, jogging, running without difficulty. Patient will benefit from skilled physical therapy intervention to address current body structure impairments and activity limitations to improve function and work towards goals set in current POC in order to return to prior level of function or maximal functional improvement.    OBJECTIVE IMPAIRMENTS Abnormal gait, decreased activity tolerance, decreased balance, decreased coordination, decreased endurance, decreased knowledge of condition, decreased mobility, difficulty walking, decreased ROM, decreased strength, impaired perceived functional ability, impaired sensation, impaired tone, and improper body mechanics.    ACTIVITY LIMITATIONS community activity, occupation, and   usual physical function and basic mobility such as bed mobility, transfers, community and household ambulation, working, lifting, carrying, bending, jogging, running.    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      REHAB POTENTIAL: Good   CLINICAL DECISION MAKING: Evolving/moderate complexity  EVALUATION COMPLEXITY: Moderate     GOALS: Goals reviewed with patient? No   SHORT TERM GOALS: Target date: 07/19/2021    Patient will be independent with initial home exercise program for self-management of symptoms. Baseline: Initial HEP to be provided at visit 2 as appropriate (07/05/21); Goal status: Met    LONG TERM GOALS: Target date: 09/27/2021   Patient will be independent with a long-term home exercise program for self-management of symptoms.  Baseline: Initial HEP to be provided at visit 2 as appropriate (07/05/21); Goal status: In-progress   2.  Patient will demonstrate improved FOTO to equal or greater than 67 by visit #14 to demonstrate improvement in overall condition and self-reported functional ability.  Baseline: 42 (07/05/21); 63 (08/11/2021);  Goal status: In-progress   3.  Patient will demonstrate L hip and knee MMT equal or greater to R hip and knee MMT to demonstrate improved strength for functional activities such as walking and working.  Baseline: lacking strength on left LE - see objective (07/05/21); L knee extension strength 1/5 (08/23/2021:  Goal status: On-going   4.  Patient will ambulate equal or greater than 1000 feet during 6 Minute Walk Test with no assistive device and normal gait pattern to demonstrate improved community and household ambulation and decreased fall risk.  Baseline: ambulates with SPC in R LE with antalgic gait favoring L LE and minimizing weight bearing through L LE.  (07/05/21);  843 feet with SPC in R hand, locking left knee for stability, one stumble and patient recovered without falling or physical assist form PT. SBA provided for safety (07/14/2021);  Goal status: In-progress   5.  Patient will complete community, work and/or recreational activities without limitation due to current condition.  Baseline: difficulty usual physical function and basic mobility such as bed mobility, transfers, community and household ambulation, working, lifting, carrying, bending, jogging, running (07/05/21); Goal status: In-progress   PLAN: PT FREQUENCY: 2x/week   PT  DURATION: 12 weeks   PLANNED INTERVENTIONS: Therapeutic exercises, Therapeutic activity, Neuromuscular re-education, Balance training, Gait training, Patient/Family education, Joint mobilization, Stair training, DME instructions, Dry Needling, Electrical stimulation, Spinal mobilization, Cryotherapy, Moist heat, Manual therapy, and Re-evaluation.   PLAN FOR NEXT SESSION: Update HEP as appropriate, progressive LE strengthening and balance as tolerated.    Everlean Alstrom. Graylon Good, PT, DPT 08/26/21, 7:31 PM  [facility] [address] [place], [place] [address] P: [phone] I F: [phone]

## 2021-08-31 ENCOUNTER — Encounter: Payer: Self-pay | Admitting: Physical Therapy

## 2021-08-31 ENCOUNTER — Ambulatory Visit: Payer: 59 | Admitting: Physical Therapy

## 2021-08-31 DIAGNOSIS — Z9181 History of falling: Secondary | ICD-10-CM

## 2021-08-31 DIAGNOSIS — R262 Difficulty in walking, not elsewhere classified: Secondary | ICD-10-CM

## 2021-08-31 DIAGNOSIS — M6281 Muscle weakness (generalized): Secondary | ICD-10-CM | POA: Diagnosis not present

## 2021-08-31 DIAGNOSIS — M79605 Pain in left leg: Secondary | ICD-10-CM

## 2021-08-31 NOTE — Therapy (Addendum)
OUTPATIENT PHYSICAL THERAPY TREATMENT / PROGRESS NOTE Dates of reporting from 08/17/2021 to 08/31/2021  Patient Name: Shane Dunn MRN: [id number] DOB:Aug 04, 1965, 56 y.o., male Today's Date: 08/31/21   PCP: Shane Dunn REFERRING PROVIDER: Luella Cook, PA-C [facility])  END OF SESSION:   PT End of Session - 08/31/21 1305     Visit Number 14    Number of Visits 24    Date for PT Re-Evaluation 10/03/21    Authorization Type Shane Dunn reporting from 08/17/2021    Authorization Time Period VL 30 PT/OT/Chiro    Authorization - Visit Number 14    Authorization - Number of Visits 30    Progress Note Due on Visit 20    PT Start Time 7341    PT Stop Time 9379    PT Time Calculation (min) 49 min    Activity Tolerance Patient tolerated treatment well;No increased pain    Behavior During Therapy Shane Dunn for tasks assessed/performed              History reviewed. No pertinent past medical history. History reviewed. No pertinent surgical history. There are no problems to display for this patient.   REFERRING DIAG: leg weakness, difficulty walking  THERAPY DIAG:  Muscle weakness (generalized)  Pain in left leg  Difficulty in walking, not elsewhere classified  History of falling  Rationale for Evaluation and Treatment: Rehabilitation  ONSET DATE: 04/25/2021  PERTINENT HISTORY: Patient is a 56 y.o. male who presents to outpatient physical therapy with a referral for medical diagnosis leg weakness, difficulty walking. This patient's chief complaints consist of sudden onset left lower leg pain, possibly paresthesia, and L LE weakness resulting in falls and leading to the following functional deficits: difficulty with usual physical function and basic mobility such as bed mobility, transfers, community and household ambulation, working, lifting, carrying, bending, jogging, running. 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  Patient denies hx of cancer, stroke, seizures, lung problems, heart problems, unexplained weight loss, unexplained changes in bowel or bladder problems, unexplained stumbling or dropping things, and spinal surgery  PRECAUTIONS:  Other: not allowed to eat sugar, salt, and sweet drinks.  SUBJECTIVE: Patient reports he is feeling well and has no pain upon arrival. He arrives on his Shane Dunn and rode his moped here. He reports no changes since last PT session. Patient's mother Shane Dunn arrived at end of session and had questions about patient's progress.   PAIN:  Are you having pain? no   OBJECTIVE  MUSCLE PERFORMANCE (MMT):  *Indicates pain 07/05/21 08/11/21 08/31/21  Joint/Motion R/L R/L R/L  Hip        Flexion (L1, L2) 4/3 5/3+ 5/4  Extension (knee ext) 4+/4- 4+/4+ 4+/4+  Abduction 5/4 5/5 5/5  Knee        Extension (L3) 5/2 5/2 5/1  Flexion (S2) 5/5 5/5 5/5  Ankle/Foot        Dorsiflexion (L4) 5/4+ 5/4+ 5/4+  Great toe extension (L5) 5/5 5/5 5/5  Eversion (S1) 5/5 5/5 5/5  Plantarflexion (S1) 4+/4+ 4/4 5/5  Comments:  08/11/2021: L knee AROM extension (from seated at 100 flexion): ~ -70 degrees. 08/31/2021: unable to perform full L knee extension AROM in gravity minimized position  TODAY'S TREATMENT   Therapeutic exercise: to centralize symptoms and improve ROM, strength, muscular endurance, and activity tolerance required for successful completion of functional activities.   CIRCUIT: - reverse "deadmill" 3x2 min.  -  seated forwards/backwards walking stool scoots while sitting on rolling stool, 2x30 feet each direction attempting to use L LE as much as possible (unable to push backwards effectively with just L LE).   - TOTAL GYM single leg press at level 10, 3x10 each side, manual assistance at start of press on L LE with slow eccentric phase to encourage max motor unit activation. - measurements to assess progress (see  above).   Pt required multimodal cuing for proper technique and to facilitate improved neuromuscular control, strength, range of motion, and functional ability resulting in improved performance and form. Guarding and cuing needed for safety.   PATIENT EDUCATION:  Education details: form/technique with exercise.POC, progress.  Person educated: Patient and mother Education method: Explanation, demonstration, verbal, visual cuing.  Education comprehension: verbalized understanding, returned demonstration.      HOME EXERCISE PROGRAM: Access Code: [email or web address] URL: [email or web address] Date: 07/26/2021 Prepared by: Shane Dunn  Exercises - Sit to Stand Without Arm Support  - 1 x daily - 3 sets - 10 reps - Seated Long Arc Quad  - 1 x daily - 3 sets - 10 reps   ASSESSMENT:   CLINICAL IMPRESSION: Patient has attended 14 physical therapy sessions since starting this episode of care on 07/05/2021.  He tolerated today's treatment well with no increase in pain by end of session. Patient continues to be motivated to participate and gives great effort. He continues to demonstrate visible and palpable contraction of L quads but  is unable to extend knee against gravity or even in gravity minimized position (MMT 1/5). He is very good at compensating for L quad weakness and it is PT's belief that previous measurements of 2/5 MMT for L quad was actually patient positioning his leg so that gravity would assist with extension when attempting to be in a gravity neutral position. Patient continues to lock L knee in stance phase for stability, which gives an appearance of quad strength but is unable to hold him up when he has any flexion of the L knee. Locking also puts pressure on posterior knee capsule. He may benefit from swedish knee cage at some point in the future to help protect knee joint from hyperextension. At this point, patient's primary and concerning impairment is L quad weakness that has  not improved over the last 2 months despite intensive efforts to improve it. Patient appears to have improved his ability to compensate for L quad weakness through use of hip extension and ankle dorsifleixon to perform movements and exercises where they are able to contribute to knee extension. However, patient is unable to perform all exercises that isolate the quads. Patient is to see neurologist on 7/12 to discuss further testing and treatment options. From a rehab standpoint, patient appears to have exhausted the benefits of PT at this time. With no change in L quad strength since starting PT. Therefore, PT recommends discontinuing PT at this point unless there is a change in rehab potential or patient's quads show signs of improved contraction ability. This was discussed with patient's mother/guardian and she voiced agreement.    Assessment from PT eval 07/05/2021: Patient is a 56 y.o. male referred to outpatient physical therapy with a medical diagnosis of leg weakness, difficulty walking who presents with signs and symptoms consistent with left LE weakness, unsteady on feet, abnormal gait. Patient's weakness is most profound in left knee extension followed by L hip flexion. He also has weakness of other hip motions that are not as prominent. Unable to  identify lumbar source of pain and testing for upper motor neuron injury was negative. Unclear why patient experienced sudden weakness or why it has not been restored at this point. Patient does have difficulty with communication and has cognitive differences related to his mother's information that he is likely on the autism spectrum, which hinders communication about symptoms during testing. Patient presents with significant paresthesia?, AROM, gait, balance, posture, muscle performance (strength/power/endurance) and activity tolerance impairments that are limiting ability to complete his usual physical function and basic mobility such as bed mobility,  transfers, community and household ambulation, working, lifting, carrying, bending, jogging, running without difficulty. Patient will benefit from skilled physical therapy intervention to address current body structure impairments and activity limitations to improve function and work towards goals set in current POC in order to return to prior level of function or maximal functional improvement.    OBJECTIVE IMPAIRMENTS Abnormal gait, decreased activity tolerance, decreased balance, decreased coordination, decreased endurance, decreased knowledge of condition, decreased mobility, difficulty walking, decreased ROM, decreased strength, impaired perceived functional ability, impaired sensation, impaired tone, and improper body mechanics.    ACTIVITY LIMITATIONS community activity, occupation, and   usual physical function and basic mobility such as bed mobility, transfers, community and household ambulation, working, lifting, carrying, bending, jogging, running.    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      REHAB POTENTIAL: Good   CLINICAL DECISION MAKING: Evolving/moderate complexity   EVALUATION COMPLEXITY: Moderate     GOALS: Goals reviewed with patient? No   SHORT TERM GOALS: Target date: 07/19/2021   Patient will be independent with initial home exercise program for self-management of symptoms. Baseline: Initial HEP to be provided at visit 2 as appropriate (07/05/21); Goal status: Met    LONG TERM GOALS: Target date: 09/27/2021   Patient will be independent with a long-term home exercise program for self-management of symptoms.  Baseline: Initial HEP to be provided at visit 2 as appropriate (07/05/21); patient is participating (08/31/2021);  Goal  status: met as able.    2.  Patient will demonstrate improved FOTO to equal or greater than 67 by visit #14 to demonstrate improvement in overall condition and self-reported functional ability.  Baseline: 42 (07/05/21); 63 (08/11/2021);  Goal status: Partially met   3.  Patient will demonstrate L hip and knee MMT equal or greater to R hip and knee MMT to demonstrate improved strength for functional activities such as walking and working.  Baseline: lacking strength on left LE - see objective (07/05/21); L knee extension strength 1/5 (08/23/2021); L knee extension strength 1/5 (08/31/2021);  Goal status: not met   4.  Patient will ambulate equal or greater than 1000 feet during 6 Minute Walk Test with no assistive device and normal gait pattern to demonstrate improved community and household ambulation and decreased fall risk.  Baseline: ambulates with SPC in R LE with antalgic gait favoring L LE and minimizing weight bearing through L LE.  (07/05/21);  843 feet with SPC in R hand, locking left knee for stability, one stumble and patient recovered without falling or physical assist form PT. SBA provided for safety (07/14/2021);  Goal status: not met   5.  Patient will complete community, work and/or recreational activities without limitation due to current condition.  Baseline: difficulty usual physical function and basic mobility such as bed  mobility, transfers, community and household ambulation, working, lifting, carrying, bending, jogging, running (07/05/21); patient reports improvements but still cannot complete transfers, household and community mobility, carrying, and bending without difficulty and is unable to work, lift, jog or run. Patient demonstrates compensation for non-functional L quad in clinic (08/31/2021);  Goal status: not-met   PLAN: PT FREQUENCY: 2x/week   PT DURATION: 12 weeks   PLANNED INTERVENTIONS: Therapeutic exercises, Therapeutic activity, Neuromuscular re-education,  Balance training, Gait training, Patient/Family education, Joint mobilization, Stair training, DME instructions, Dry Needling, Electrical stimulation, Spinal mobilization, Cryotherapy, Moist heat, Manual therapy, and Re-evaluation.   PLAN FOR NEXT SESSION: PT recommends discontinuing PT at this point unless there is a change in rehab potential or patient's quads show signs of improved contraction ability. This was discussed with patient's mother/guardian and she voiced agreement.   Everlean Alstrom. Graylon Good, PT, DPT 08/31/21, 7:43 PM  [facility] [address] [place], [place] [address] P: [phone] I F: [phone]

## 2021-09-02 ENCOUNTER — Ambulatory Visit: Payer: 59 | Admitting: Physical Therapy

## 2021-09-02 ENCOUNTER — Telehealth: Payer: Self-pay | Admitting: Physical Therapy

## 2021-09-02 NOTE — Telephone Encounter (Signed)
Called patient's mother/guardian Rena to get update on plans for PT since neurologist appointment yesterday. She said they are considering proceeding with EMG testing depending on insurance coverage. She agreed they are going to discontinue PT at this time.   Luretha Murphy. Ilsa Iha, PT, DPT 09/02/21, 7:18 PM  [facility] [address] [place], [place] [address] P: [phone] I F: [phone]

## 2021-09-07 ENCOUNTER — Ambulatory Visit: Payer: 59 | Admitting: Physical Therapy

## 2021-09-09 ENCOUNTER — Encounter: Payer: 59 | Admitting: Physical Therapy

## 2021-09-14 ENCOUNTER — Encounter: Payer: 59 | Admitting: Physical Therapy

## 2021-09-16 ENCOUNTER — Encounter: Payer: 59 | Admitting: Physical Therapy

## 2021-09-20 ENCOUNTER — Encounter: Payer: 59 | Admitting: Physical Therapy

## 2021-09-21 ENCOUNTER — Encounter: Payer: 59 | Admitting: Physical Therapy

## 2021-09-28 ENCOUNTER — Encounter: Payer: 59 | Admitting: Physical Therapy

## 2021-09-30 ENCOUNTER — Encounter: Payer: 59 | Admitting: Physical Therapy

## 2021-10-04 ENCOUNTER — Encounter: Payer: 59 | Admitting: Physical Therapy

## 2021-10-06 ENCOUNTER — Encounter: Payer: 59 | Admitting: Physical Therapy

## 2021-10-12 ENCOUNTER — Encounter: Payer: 59 | Admitting: Physical Therapy

## 2021-10-14 ENCOUNTER — Encounter: Payer: 59 | Admitting: Physical Therapy

## 2021-10-19 ENCOUNTER — Encounter: Payer: 59 | Admitting: Physical Therapy

## 2021-10-21 ENCOUNTER — Encounter: Payer: 59 | Admitting: Physical Therapy

## 2021-10-26 ENCOUNTER — Encounter: Payer: 59 | Admitting: Physical Therapy

## 2021-10-28 ENCOUNTER — Encounter: Payer: 59 | Admitting: Physical Therapy
